# Patient Record
Sex: Female | Born: 1975 | Race: White | Hispanic: No | Marital: Married | Smoking: Never smoker
Health system: Southern US, Community
[De-identification: ages and names within clinical notes are randomized; demographics above are authoritative.]

## PROBLEM LIST (undated history)

## (undated) DIAGNOSIS — N200 Calculus of kidney: Secondary | ICD-10-CM

## (undated) DIAGNOSIS — I781 Nevus, non-neoplastic: Principal | ICD-10-CM

## (undated) DIAGNOSIS — E039 Hypothyroidism, unspecified: Secondary | ICD-10-CM

## (undated) HISTORY — PX: TUBAL LIGATION: SHX77

## (undated) HISTORY — DX: Nevus, non-neoplastic: I78.1

---

## 2004-03-31 ENCOUNTER — Emergency Department (HOSPITAL_COMMUNITY): Admission: EM | Admit: 2004-03-31 | Discharge: 2004-03-31 | Payer: Self-pay | Admitting: Emergency Medicine

## 2004-05-31 ENCOUNTER — Ambulatory Visit: Payer: Self-pay | Admitting: Family Medicine

## 2004-12-20 ENCOUNTER — Inpatient Hospital Stay (HOSPITAL_COMMUNITY): Admission: AD | Admit: 2004-12-20 | Discharge: 2004-12-20 | Payer: Self-pay | Admitting: Obstetrics and Gynecology

## 2004-12-28 ENCOUNTER — Other Ambulatory Visit: Admission: RE | Admit: 2004-12-28 | Discharge: 2004-12-28 | Payer: Self-pay | Admitting: Obstetrics and Gynecology

## 2005-05-15 ENCOUNTER — Ambulatory Visit (HOSPITAL_COMMUNITY): Admission: RE | Admit: 2005-05-15 | Discharge: 2005-05-15 | Payer: Self-pay | Admitting: Obstetrics and Gynecology

## 2005-06-23 ENCOUNTER — Inpatient Hospital Stay (HOSPITAL_COMMUNITY): Admission: AD | Admit: 2005-06-23 | Discharge: 2005-06-23 | Payer: Self-pay | Admitting: Obstetrics and Gynecology

## 2005-07-24 ENCOUNTER — Inpatient Hospital Stay (HOSPITAL_COMMUNITY): Admission: AD | Admit: 2005-07-24 | Discharge: 2005-07-26 | Payer: Self-pay | Admitting: Obstetrics and Gynecology

## 2006-12-01 IMAGING — US US RENAL
1 series · 14 of 25 positions shown · non-contrast
Comparison: none

HISTORY: Left flank pain, pregnant

[Series 1: us renal · 0.37mm/px · 14 of 51 slices shown]
[im 1/51]
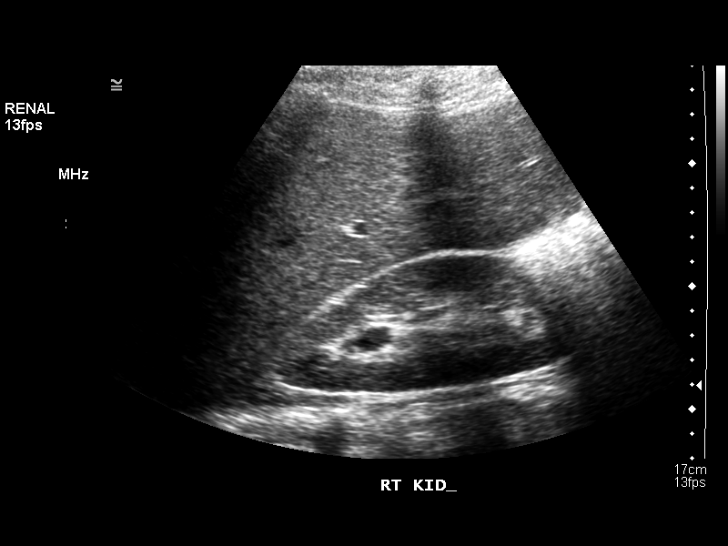
[im 5/51]
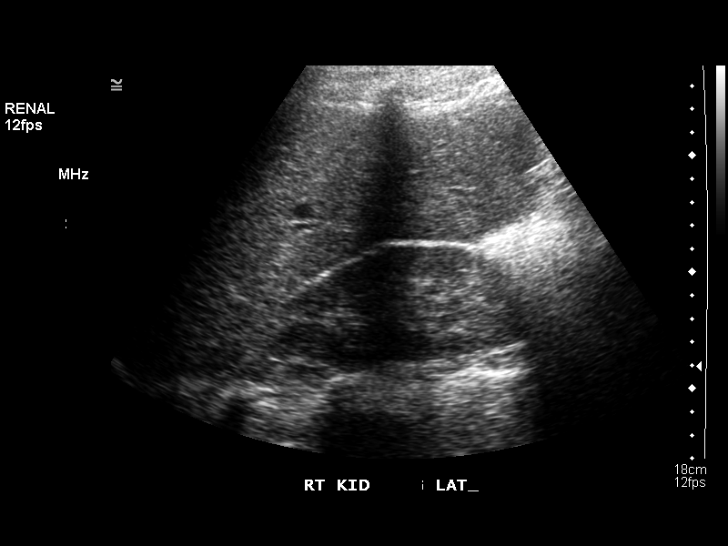
[im 9/51]
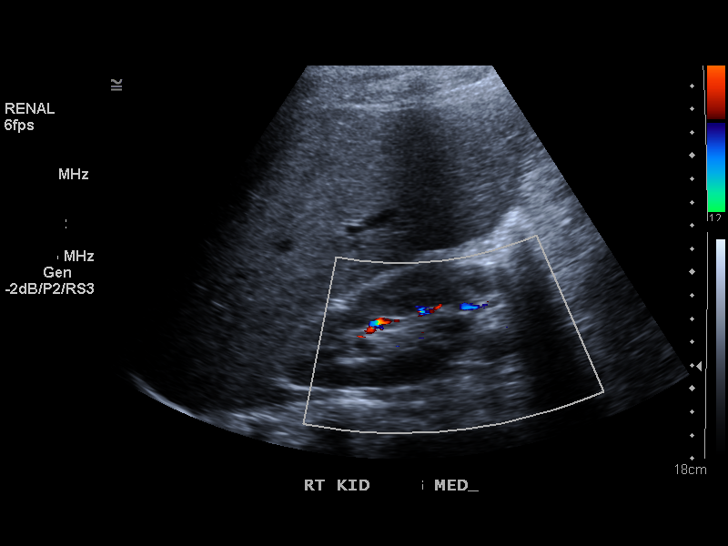
[im 13/51]
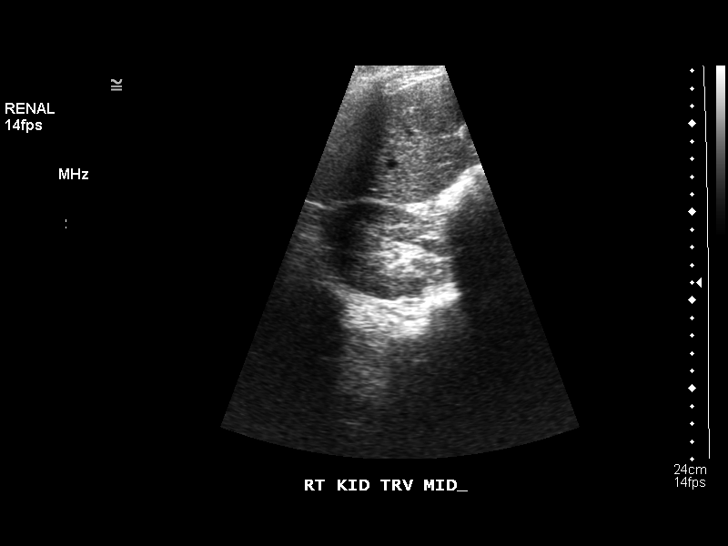
[im 17/51]
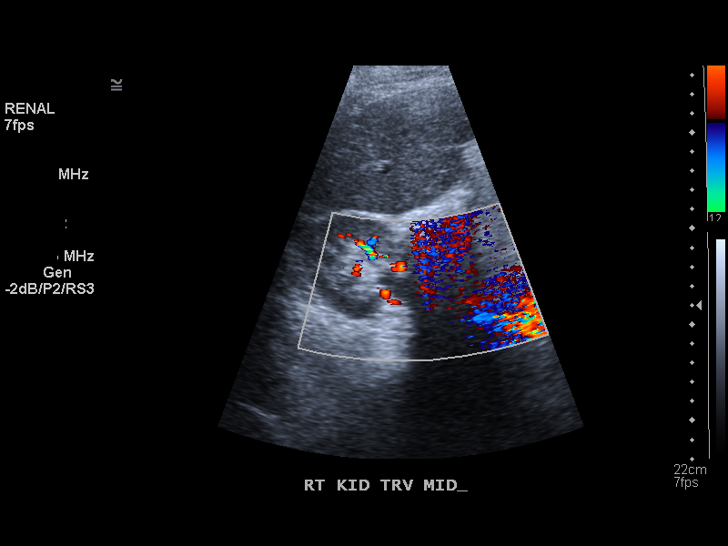
[im 19/51]
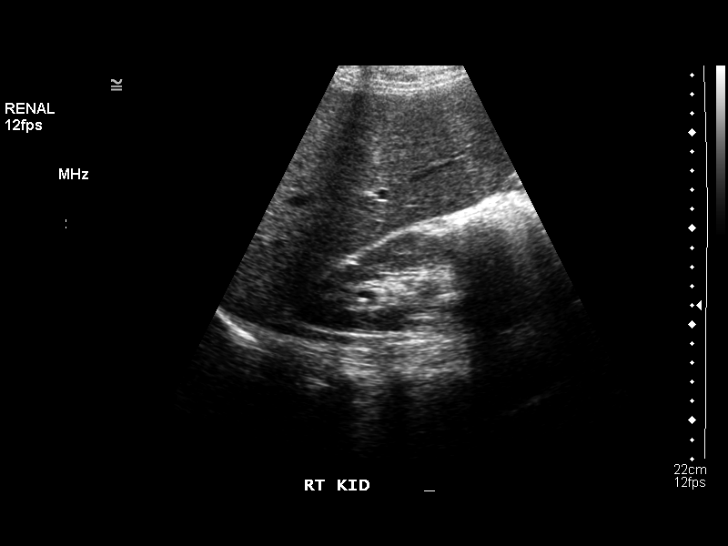
[im 23/51]
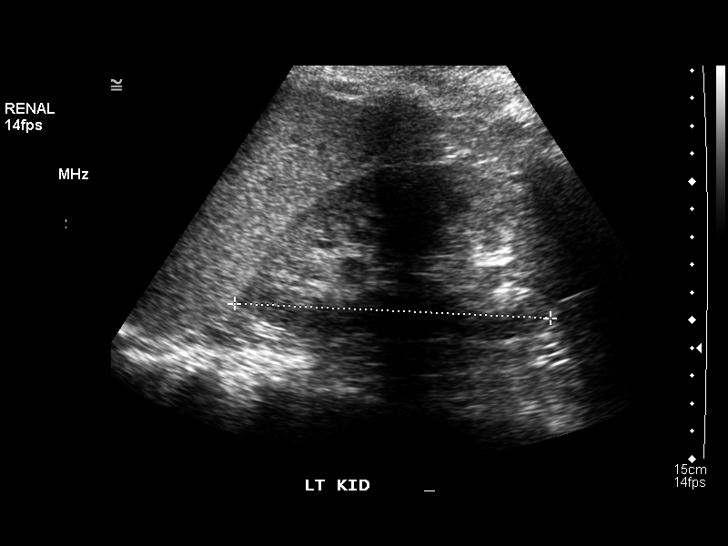
[im 28/51]
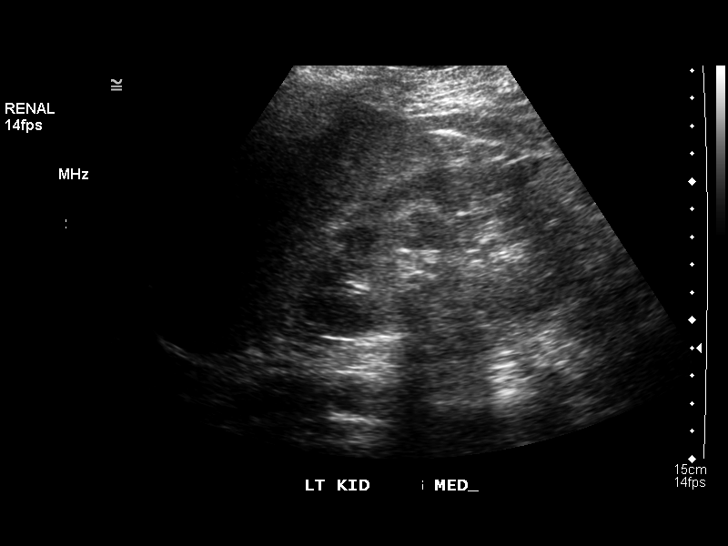
[im 32/51]
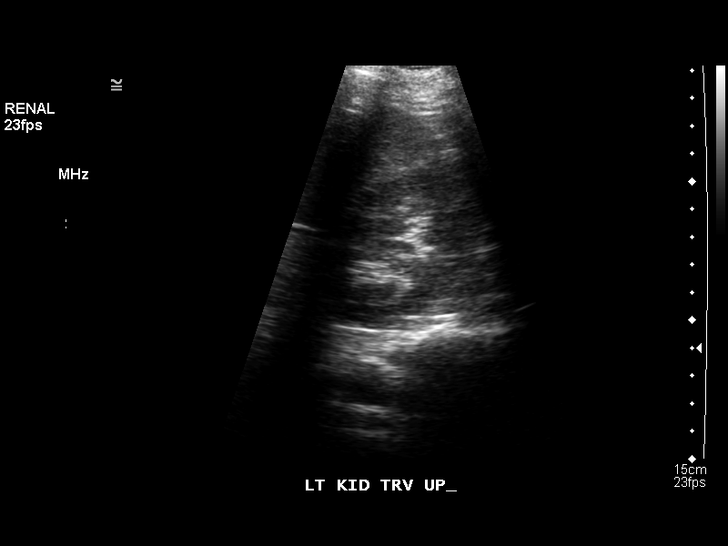
[im 34/51]
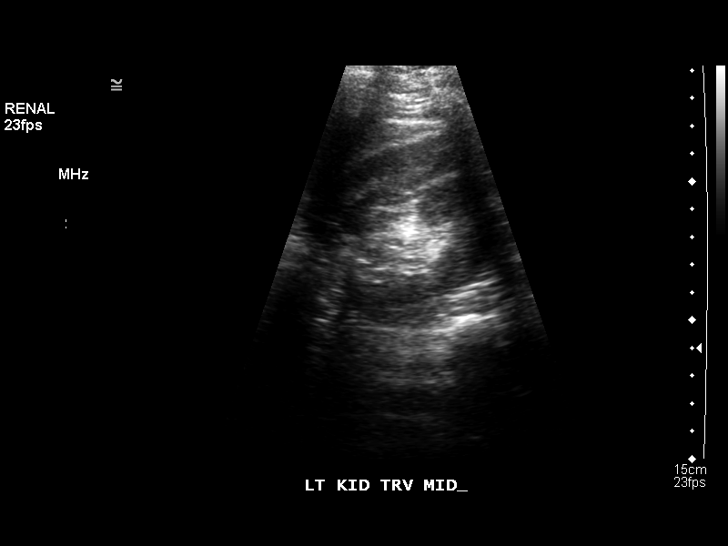
[im 38/51]
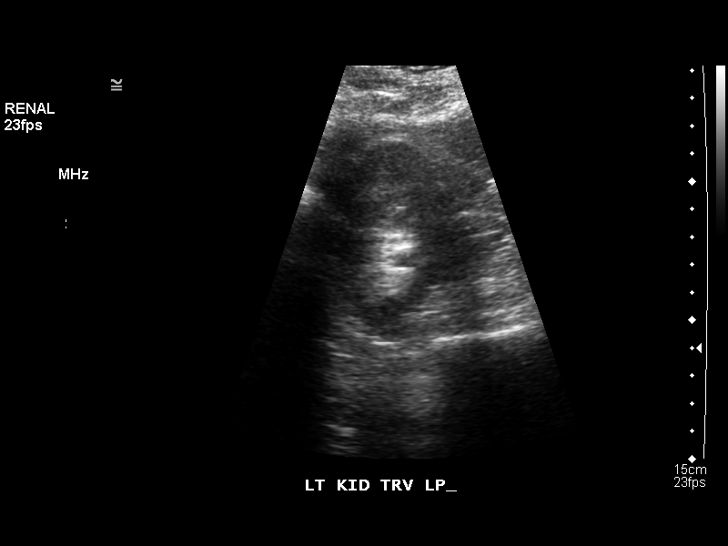
[im 42/51]
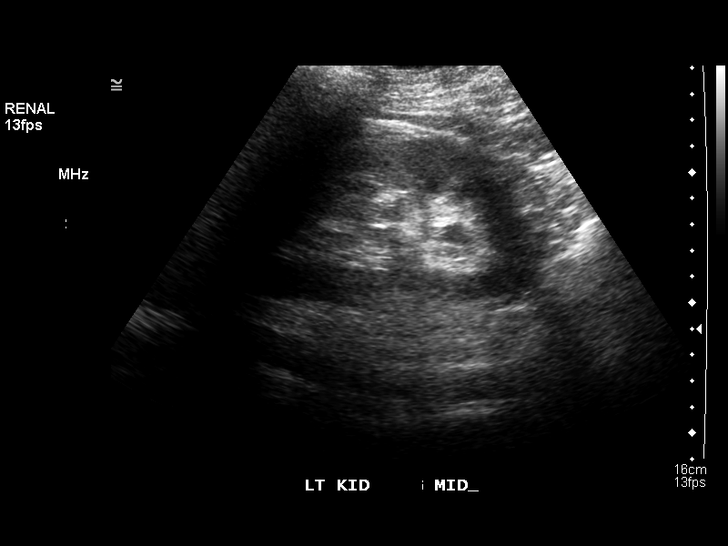
[im 46/51]
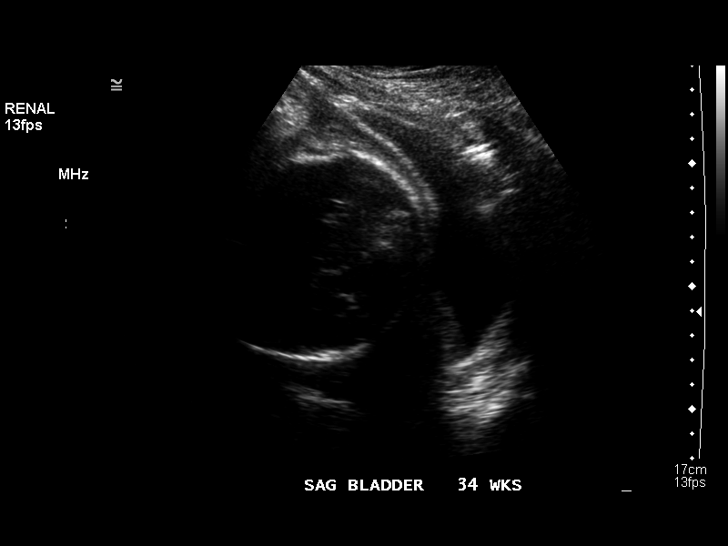
[im 51/51]
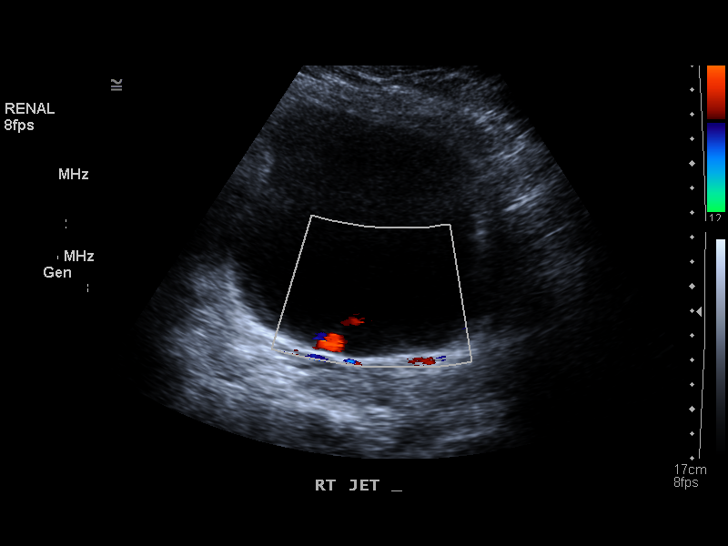

[14 of 25 positions shown; findings below may reference images not displayed]

RENAL ULTRASOUND:

Kidneys measure 11.9 cm length right and 11.7 cm length left.
Normal renal cortical thickness and echogenicity bilaterally.
No evidence of renal mass or hydronephrosis.
Very minimal caliectasis bilaterally, which may be seen in late pregnancy.
At lower pole left kidney, a single tiny shadowing echogenic focus is seen which
could represent a tiny calculus.
However, left renal pelvis does not appear dilated.
Urinary bladder normal appearance.
A right ureteral jet is visualized.
A left ureteral jet is not seen during exam.
IMPRESSION: Probable tiny calculus lower pole left kidney.
Minimal caliectasis bilaterally without hydronephrosis.
A left ureteral jet is not identified, cannot confirm left ureteral patency.

## 2007-04-16 ENCOUNTER — Encounter: Payer: Self-pay | Admitting: Family Medicine

## 2008-06-17 ENCOUNTER — Ambulatory Visit: Payer: Self-pay | Admitting: Gastroenterology

## 2010-05-19 NOTE — Discharge Summary (Signed)
[name], [name]                 ACCOUNT NO.:  [id number]   MEDICAL RECORD NO.:  [id number]          PATIENT TYPE:  INP   LOCATION:  9106                          FACILITY:  [facility]   PHYSICIAN:  Juluis Mire, M.D.   DATE OF BIRTH:  27-Jan-1975   DATE OF ADMISSION:  07/24/2005  DATE OF DISCHARGE:  07/26/2005                                 DISCHARGE SUMMARY   ADMITTING DIAGNOSES:  1. Intrauterine pregnancy at term.  2. Previous cesarean section, desires repeat.   DISCHARGE DIAGNOSES:  1. Status post low transverse cesarean section.  2. Viable female infant.   PROCEDURE:  Repeat low transverse cesarean section.   REASON FOR ADMISSION:  Please see dictated H&P.   HOSPITAL COURSE:  Patient is a 35 year old white married female gravida 2,  para 1 that was admitted to [facility] for scheduled  cesarean section.  Patient had had a previous cesarean delivery and desired  repeat.  On the morning of admission patient was taken to the operating room  where spinal anesthesia was administered without difficulty.  A low  transverse incision was made with delivery of a viable female infant weighing  7 pounds 10 ounces with Apgars of 8 at one minute, 9 at five minutes.  Patient tolerated procedure well and was taken to recovery room in stable  condition.  On postoperative day one patient was without complaint.  Pain  was well-controlled, tolerating a regular diet without complaints of nausea  and vomiting.  Vital signs were stable.  She was afebrile.  Abdomen soft.  Abdominal dressing was noted to be clean, dry, and intact.  Laboratory  findings revealed hemoglobin of 10.6, platelet count 178,000, WBC count was  7.6.  Patient was known to be Rh negative.  RhoGAM studies were pending.  On  postoperative day two patient desired early discharge.  Vital signs were  stable.  She was afebrile.  Fundus firm and nontender.  Incision was clean,  dry, and intact.  Staples were intact.   RhoGAM was administered.  Discharge  instructions reviewed and patient was discharged home.   CONDITION ON DISCHARGE:  Good.   DIET:  Regular, as tolerated.   ACTIVITY:  No heavy lifting, no driving x2 weeks, no vaginal entry.   FOLLOW-UP:  Patient to follow up in the office in three days for a staple  removal.  She is to call for temperature greater than 100.5, redness or  drainage from the incisional site, heavy vaginal bleeding and/or redness or  drainage from the incisional site.   DISCHARGE MEDICATIONS:  1. Percocet 5/325, #30 one p.o. every four to six hours p.r.n.  2. Motrin 600 mg every six hours.  3. Prenatal vitamins one p.o. daily.      Julio Sicks, N.P.      Juluis Mire, M.D.  Electronically Signed    CC/MEDQ  D:  08/22/2005  T:  08/22/2005  Job:  [id number]

## 2010-05-19 NOTE — Op Note (Signed)
[name], [name]                 ACCOUNT NO.:  [id number]   MEDICAL RECORD NO.:  [id number]          PATIENT TYPE:  INP   LOCATION:  9399                          FACILITY:  [facility]   PHYSICIAN:  Guy Sandifer. Henderson Cloud, M.D. DATE OF BIRTH:  [birth year]   DATE OF PROCEDURE:  07/24/2005  DATE OF DISCHARGE:  06/23/2005                                 OPERATIVE REPORT   PREOPERATIVE DIAGNOSIS:  Previous cesarean section, desires repeat.   POSTOPERATIVE DIAGNOSIS:  Previous cesarean section, desires repeat.   PROCEDURE:  Repeat low transverse cesarean section.   SURGEON:  Harold Hedge, MD   ANESTHESIA:  Spinal, Raul Del, M.D.   ESTIMATED BLOOD LOSS:  800 mL.   FINDINGS:  Viable female infant, Apgars of 8 and 9 at one and five minutes,  respectively.  Birth weight 7 pounds 10 ounces.  Arterial cord pH pending.   INDICATIONS AND CONSENT:  The patient is 35 year old married white female  G2, P1 with an EDC of 08/01/2005.  She has previous cesarean section.  After  careful consideration of the options, she desires repeat.  Potential risks  and complications have been discussed preoperatively including but not  limited to infection, bowel, bladder, ureteral damage, bleeding requiring  transfusion of blood products with possible transfusion reaction, HIV and  hepatitis acquisition, DVT, PE, pneumonia.  All questions were answered and  consent is signed on the chart.   PROCEDURE:  The patient taken to operating room where she is identified.  Spinal anesthetic is placed and she is placed in dorsosupine position with a  15 degree left lateral wedge.  She is then prepped, Foley catheter was  placed and the bladder is drain and she is draped in sterile fashion.  After  testing for adequate spinal anesthesia, skin is entered through the previous  Pfannenstiel scar.  Dissection is carried out in layers to the peritoneum.  The peritoneum is incised, extended superiorly and inferiorly.  Vesicouterine peritoneum was taken down cephalolaterally.  The bladder flap  was developed.  The bladder blade was placed.  Uterus was incised in a low  transverse manner.  The uterine cavity is entered bluntly with a hemostat.  Uterine incision was then extended cephalolaterally with the fingers.  Clear  fluid is noted.  Vertex was delivered and the oro nasopharynx were  suctioned.  Remainder of baby is delivered.  Good cry and tone is noted.  Cord is clamped and cut.  The baby is handed to the waiting pediatrics team.  Placenta was manually delivered.  Uterus is clean.  Uterus is closed in two  running locking imbricating layers of 0 Monocryl suture.  A single figure-of-  eight is placed in the middle of the incision to obtain complete hemostasis.  Tubes and ovaries were normal bilaterally.  There is a single adhesion of  the omentum to the anterior abdominal wall which is at taken down with  cautery without difficulty.  Good hemostasis was noted there as well.  Anterior peritoneum was closed in running fashion with 0 Monocryl suture  which was also  used  to reapproximate the pyramidalis muscle in the midline.  The anterior  rectus fascia is closed in running fashion with 0 PDS suture and skin was  closed with clips.  All sponge, instrument and needle counts were correct.  The patient is transferred to recovery room in stable condition.      Guy Sandifer Henderson Cloud, M.D.  Electronically Signed     JET/MEDQ  D:  07/24/2005  T:  07/24/2005  Job:  [id number]

## 2010-05-19 NOTE — H&P (Signed)
[name], [name]                 ACCOUNT NO.:  [id number]   MEDICAL RECORD NO.:  [id number]          PATIENT TYPE:  INP   LOCATION:  NA                            [facility]  [facility]   PHYSICIAN:  Guy Sandifer. Henderson Cloud, M.D. DATE OF BIRTH:  05/01/1975   DATE OF ADMISSION:  07/24/2005  DATE OF DISCHARGE:                                HISTORY & PHYSICAL   CHIEF COMPLAINT:  Desires repeat cesarean section.   HISTORY OF PRESENT ILLNESS:  This patient is a 35 year old married female,  G2, P46, with an EDC of August 01, 2005, with a previous cesarean section.  After careful discussion of the options, she is being admitted for repeat  cesarean section.   PAST MEDICAL HISTORY, PAST SURGICAL HISTORY, FAMILY HISTORY, OBSTETRIC  HISTORY:  See prenatal History and Physical.   ALLERGIES:  SULFA.   MEDICATIONS:  Prenatal vitamins.   SOCIAL HISTORY:  Denies tobacco, alcohol, or drug abuse.   REVIEW OF SYSTEMS:  NEUROLOGIC: Denies headache.  CARDIAC: Denies chest  pain.  GI: Denies recent changes in bowel habits.   PHYSICAL EXAMINATION:  VITAL SIGNS: Height 5 feet 5-1/2 inches.  Weight 224  pounds. Blood pressure 104/80.  LUNGS: Clear to auscultation.  HEART: Regular rate and rhythm.  BREASTS:  Not examined.  ABDOMEN:  Gravid, nontender.  PELVIC:  Cervix closed, thick, -1, vertex.  NEUROLOGIC:  Grossly within normal limits.   ASSESSMENT:  1.  Intrauterine pregnancy at approximately 39 weeks estimated gestational      age.  2.  Previous cesarean section.   PLAN:  Repeat cesarean section.     Guy Sandifer Henderson Cloud, M.D.  Electronically Signed    JET/MEDQ  D:  07/23/2005  T:  07/23/2005  Job:  [id number]

## 2010-07-02 ENCOUNTER — Inpatient Hospital Stay (HOSPITAL_COMMUNITY): Admission: AD | Admit: 2010-07-02 | Payer: Self-pay | Source: Home / Self Care | Admitting: Obstetrics and Gynecology

## 2010-07-17 ENCOUNTER — Encounter (HOSPITAL_COMMUNITY): Payer: Self-pay

## 2010-07-17 ENCOUNTER — Encounter (HOSPITAL_COMMUNITY)
Admission: RE | Admit: 2010-07-17 | Discharge: 2010-07-17 | Disposition: A | Discharge status: Home / Self Care | Payer: BC Managed Care – PPO | Source: Ambulatory Visit | Attending: Obstetrics and Gynecology | Admitting: Obstetrics and Gynecology

## 2010-07-17 HISTORY — DX: Hypothyroidism, unspecified: E03.9

## 2010-07-17 LAB — CBC
HCT: 38.4 % (ref 36.0–46.0)
Hemoglobin: 12.9 g/dL (ref 12.0–15.0)
MCV: 87.9 fL (ref 78.0–100.0)
Platelets: 160 10*3/uL (ref 150–400)
RDW: 15 % (ref 11.5–15.5)

## 2010-07-17 LAB — SURGICAL PCR SCREEN
MRSA, PCR: NEGATIVE
Staphylococcus aureus: NEGATIVE

## 2010-07-17 NOTE — Anesthesia Preprocedure Evaluation (Addendum)
Anesthesia Evaluation  Name, MR# and DOB Patient awake  General Assessment Comment  Airway       Dental   Pulmonaryneg pulmonary ROS        Cardiovascular    Neuro/PsychNegative Neurological ROS Negative Psych ROS  GI/Hepatic/Renal negative GI ROS, negative Liver ROS, and negative Renal ROS (+)       Endo/Other   Abdominal   Musculoskeletal negative musculoskeletal ROS (+)  Hematology negative hematology ROS (+)   Peds negative pediatric ROS (+)  Reproductive/Obstetrics (+) Pregnancy   Anesthesia Other Findings             Anesthesia Physical Anesthesia Plan  ASA: II  Anesthesia Plan: Spinal   Post-op Pain Management:    Induction:   Airway Management Planned:   Additional Equipment:   Intra-op Plan:   Post-operative Plan:   Informed Consent: I have reviewed the patients History and Physical, chart, labs and discussed the procedure including the risks, benefits and alternatives for the proposed anesthesia with the patient or authorized representative who has indicated his/her understanding and acceptance.     Plan Discussed with:   Anesthesia Plan Comments:         Anesthesia Quick Evaluation

## 2010-07-17 NOTE — Patient Instructions (Addendum)
20 [name]  07/17/2010   Your procedure is scheduled on:  07/24/10    7:30  Report to [facility] at 600 AM.  Call this number if you have problems the morning of surgery: [phone]   Remember:   Do not eat food:After Midnight.  Do not drink clear liquids: After Midnight.  Take these medicines the morning of surgery with A SIP OF WATER: NA   Do not wear jewelry, make-up or nail polish.  Do not bring valuables to the hospital.  Contacts, dentures or bridgework may not be worn into surgery.  Leave suitcase in the car. After surgery it may be brought to your room.  For patients admitted to the hospital, checkout time is 11:00 AM the day of discharge.   Patients discharged the day of surgery will not be allowed to drive home.  Name and phone number of your driver: NA  Special Instructions:  Use CHG soap; 1/2 bottle PM prior to surgery and 1/2 bottle AM of surgery. Please read over the following fact sheets that you were given: Pain Booklet and Surgical Site Infection Prevention

## 2010-07-19 ENCOUNTER — Ambulatory Visit (HOSPITAL_COMMUNITY): Payer: Self-pay

## 2010-07-21 ENCOUNTER — Encounter (HOSPITAL_COMMUNITY): Payer: Self-pay | Admitting: *Deleted

## 2010-07-24 ENCOUNTER — Inpatient Hospital Stay (HOSPITAL_COMMUNITY)
Admission: RE | Admit: 2010-07-24 | Discharge: 2010-07-26 | DRG: 371 | Disposition: A | Discharge status: Home / Self Care | Payer: BC Managed Care – PPO | Source: Ambulatory Visit | Attending: Obstetrics and Gynecology | Admitting: Obstetrics and Gynecology

## 2010-07-24 ENCOUNTER — Inpatient Hospital Stay (HOSPITAL_COMMUNITY): Payer: BC Managed Care – PPO | Admitting: Registered Nurse

## 2010-07-24 ENCOUNTER — Encounter (HOSPITAL_COMMUNITY): Payer: Self-pay | Admitting: Anesthesiology

## 2010-07-24 ENCOUNTER — Encounter (HOSPITAL_COMMUNITY): Payer: Self-pay | Admitting: Registered Nurse

## 2010-07-24 ENCOUNTER — Encounter (HOSPITAL_COMMUNITY): Payer: Self-pay | Admitting: Obstetrics and Gynecology

## 2010-07-24 ENCOUNTER — Encounter (HOSPITAL_COMMUNITY)
Admission: RE | Disposition: A | Discharge status: Home / Self Care | Payer: Self-pay | Source: Ambulatory Visit | Attending: Obstetrics and Gynecology

## 2010-07-24 ENCOUNTER — Other Ambulatory Visit: Payer: Self-pay | Admitting: Obstetrics and Gynecology

## 2010-07-24 DIAGNOSIS — O34219 Maternal care for unspecified type scar from previous cesarean delivery: Principal | ICD-10-CM | POA: Diagnosis present

## 2010-07-24 DIAGNOSIS — Z01812 Encounter for preprocedural laboratory examination: Secondary | ICD-10-CM

## 2010-07-24 DIAGNOSIS — D649 Anemia, unspecified: Secondary | ICD-10-CM

## 2010-07-24 DIAGNOSIS — E079 Disorder of thyroid, unspecified: Secondary | ICD-10-CM | POA: Diagnosis present

## 2010-07-24 DIAGNOSIS — Z302 Encounter for sterilization: Secondary | ICD-10-CM

## 2010-07-24 DIAGNOSIS — E039 Hypothyroidism, unspecified: Secondary | ICD-10-CM | POA: Diagnosis present

## 2010-07-24 DIAGNOSIS — Z01818 Encounter for other preprocedural examination: Secondary | ICD-10-CM

## 2010-07-24 LAB — TYPE AND SCREEN
ABO/RH(D): A NEG
Antibody Screen: NEGATIVE

## 2010-07-24 SURGERY — Surgical Case
Anesthesia: Choice

## 2010-07-24 MED ORDER — OXYCODONE-ACETAMINOPHEN 5-325 MG PO TABS
1.0000 | ORAL_TABLET | ORAL | Status: DC | PRN
  Administered 2010-07-25 (×2): 1 via ORAL
  Filled 2010-07-24 (×3): qty 1

## 2010-07-24 MED ORDER — NALBUPHINE SYRINGE 5 MG/0.5 ML
5.0000 mg | INJECTION | INTRAMUSCULAR | Status: AC | PRN
  Filled 2010-07-24: qty 1

## 2010-07-24 MED ORDER — ONDANSETRON HCL 4 MG/2ML IJ SOLN
INTRAMUSCULAR | Status: AC
  Filled 2010-07-24: qty 4

## 2010-07-24 MED ORDER — CLINDAMYCIN PHOSPHATE 900 MG/50ML IV SOLN
900.0000 mg | Freq: Once | INTRAVENOUS | Status: AC
  Administered 2010-07-24: 900 mg via INTRAVENOUS
  Filled 2010-07-24: qty 50

## 2010-07-24 MED ORDER — KETOROLAC TROMETHAMINE 60 MG/2ML IM SOLN
60.0000 mg | Freq: Once | INTRAMUSCULAR | Status: AC | PRN
  Administered 2010-07-24: 60 mg via INTRAMUSCULAR

## 2010-07-24 MED ORDER — MEPERIDINE HCL 25 MG/ML IJ SOLN: 6.2500 mg | INTRAMUSCULAR | Status: DC | PRN

## 2010-07-24 MED ORDER — EPHEDRINE SULFATE 50 MG/ML IJ SOLN
INTRAMUSCULAR | Status: DC | PRN
  Administered 2010-07-24: 5 mg via INTRAVENOUS
  Administered 2010-07-24: 10 mg via INTRAVENOUS
  Administered 2010-07-24: 5 mg via INTRAVENOUS
  Administered 2010-07-24: 15 mg via INTRAVENOUS

## 2010-07-24 MED ORDER — ONDANSETRON HCL 4 MG/2ML IJ SOLN: 4.0000 mg | Freq: Three times a day (TID) | INTRAMUSCULAR | Status: DC | PRN

## 2010-07-24 MED ORDER — KETOROLAC TROMETHAMINE 30 MG/ML IJ SOLN: 30.0000 mg | Freq: Four times a day (QID) | INTRAMUSCULAR | Status: AC | PRN

## 2010-07-24 MED ORDER — SIMETHICONE 80 MG PO CHEW
80.0000 mg | CHEWABLE_TABLET | Freq: Three times a day (TID) | ORAL | Status: DC
  Administered 2010-07-24 – 2010-07-26 (×8): 80 mg via ORAL

## 2010-07-24 MED ORDER — DIPHENHYDRAMINE HCL 50 MG/ML IJ SOLN: 25.0000 mg | INTRAMUSCULAR | Status: DC | PRN

## 2010-07-24 MED ORDER — SODIUM CHLORIDE 0.9 % IV SOLN
1.0000 ug/kg/h | INTRAVENOUS | Status: DC | PRN
  Filled 2010-07-24: qty 2.5

## 2010-07-24 MED ORDER — ZOLPIDEM TARTRATE 5 MG PO TABS: 5.0000 mg | ORAL_TABLET | Freq: Every evening | ORAL | Status: DC | PRN

## 2010-07-24 MED ORDER — SCOPOLAMINE 1 MG/3DAYS TD PT72
MEDICATED_PATCH | TRANSDERMAL | Status: AC
  Administered 2010-07-24: 1.5 mg via TRANSDERMAL
  Filled 2010-07-24: qty 1

## 2010-07-24 MED ORDER — PHENYLEPHRINE HCL 10 MG/ML IJ SOLN
INTRAMUSCULAR | Status: DC | PRN
  Administered 2010-07-24: 40 ug via INTRAVENOUS
  Administered 2010-07-24 (×2): 80 ug via INTRAVENOUS
  Administered 2010-07-24 (×3): 40 ug via INTRAVENOUS
  Administered 2010-07-24 (×2): 80 ug via INTRAVENOUS
  Administered 2010-07-24: 40 ug via INTRAVENOUS

## 2010-07-24 MED ORDER — DIPHENHYDRAMINE HCL 50 MG/ML IJ SOLN: 12.5000 mg | INTRAMUSCULAR | Status: DC | PRN

## 2010-07-24 MED ORDER — KETOROLAC TROMETHAMINE 60 MG/2ML IM SOLN
INTRAMUSCULAR | Status: AC
  Administered 2010-07-24: 60 mg via INTRAMUSCULAR
  Filled 2010-07-24: qty 2

## 2010-07-24 MED ORDER — FENTANYL CITRATE 0.05 MG/ML IJ SOLN
INTRAMUSCULAR | Status: AC
  Filled 2010-07-24: qty 2

## 2010-07-24 MED ORDER — SCOPOLAMINE 1 MG/3DAYS TD PT72
1.0000 | MEDICATED_PATCH | Freq: Once | TRANSDERMAL | Status: AC
  Administered 2010-07-24: 1.5 mg via TRANSDERMAL

## 2010-07-24 MED ORDER — IBUPROFEN 600 MG PO TABS: 600.0000 mg | ORAL_TABLET | Freq: Four times a day (QID) | ORAL | Status: DC | PRN

## 2010-07-24 MED ORDER — SENNOSIDES-DOCUSATE SODIUM 8.6-50 MG PO TABS
1.0000 | ORAL_TABLET | Freq: Every day | ORAL | Status: DC
  Administered 2010-07-24 – 2010-07-25 (×2): 2 via ORAL

## 2010-07-24 MED ORDER — ONDANSETRON HCL 4 MG/2ML IJ SOLN: 4.0000 mg | INTRAMUSCULAR | Status: DC | PRN

## 2010-07-24 MED ORDER — LACTATED RINGERS IV SOLN
Freq: Once | INTRAVENOUS | Status: AC
  Administered 2010-07-24: 15:00:00 via INTRAVENOUS

## 2010-07-24 MED ORDER — ONDANSETRON HCL 4 MG PO TABS: 4.0000 mg | ORAL_TABLET | ORAL | Status: DC | PRN

## 2010-07-24 MED ORDER — FENTANYL CITRATE 0.05 MG/ML IJ SOLN
INTRAMUSCULAR | Status: DC | PRN
  Administered 2010-07-24: 15 ug via INTRATHECAL

## 2010-07-24 MED ORDER — MORPHINE SULFATE 0.5 MG/ML IJ SOLN
INTRAMUSCULAR | Status: AC
  Filled 2010-07-24: qty 10

## 2010-07-24 MED ORDER — METOCLOPRAMIDE HCL 5 MG/ML IJ SOLN: 10.0000 mg | Freq: Three times a day (TID) | INTRAMUSCULAR | Status: DC | PRN

## 2010-07-24 MED ORDER — DIPHENHYDRAMINE HCL 25 MG PO CAPS: 25.0000 mg | ORAL_CAPSULE | Freq: Four times a day (QID) | ORAL | Status: DC | PRN

## 2010-07-24 MED ORDER — WITCH HAZEL-GLYCERIN EX PADS: MEDICATED_PAD | CUTANEOUS | Status: DC | PRN

## 2010-07-24 MED ORDER — OXYTOCIN 20 UNITS IN LACTATED RINGERS INFUSION - SIMPLE
INTRAVENOUS | Status: AC
  Administered 2010-07-24: 20 [IU] via INTRAVENOUS
  Filled 2010-07-24: qty 1000

## 2010-07-24 MED ORDER — OXYTOCIN 10 UNIT/ML IJ SOLN
INTRAMUSCULAR | Status: AC
  Filled 2010-07-24: qty 4

## 2010-07-24 MED ORDER — TETANUS-DIPHTH-ACELL PERTUSSIS 5-2.5-18.5 LF-MCG/0.5 IM SUSP
0.5000 mL | Freq: Once | INTRAMUSCULAR | Status: AC
  Administered 2010-07-25: 0.5 mL via INTRAMUSCULAR

## 2010-07-24 MED ORDER — IBUPROFEN 600 MG PO TABS
600.0000 mg | ORAL_TABLET | Freq: Four times a day (QID) | ORAL | Status: DC
  Administered 2010-07-24 – 2010-07-26 (×8): 600 mg via ORAL
  Filled 2010-07-24 (×8): qty 1

## 2010-07-24 MED ORDER — PHENYLEPHRINE 40 MCG/ML (10ML) SYRINGE FOR IV PUSH (FOR BLOOD PRESSURE SUPPORT)
PREFILLED_SYRINGE | INTRAVENOUS | Status: AC
  Filled 2010-07-24: qty 5

## 2010-07-24 MED ORDER — MENTHOL 3 MG MT LOZG: 1.0000 | LOZENGE | OROMUCOSAL | Status: DC | PRN

## 2010-07-24 MED ORDER — BUPIVACAINE HCL 0.75 % IJ SOLN
INTRAMUSCULAR | Status: DC | PRN
  Administered 2010-07-24: 11.75 mg via INTRATHECAL

## 2010-07-24 MED ORDER — EPHEDRINE 5 MG/ML INJ
INTRAVENOUS | Status: AC
  Filled 2010-07-24: qty 10

## 2010-07-24 MED ORDER — OXYTOCIN 10 UNIT/ML IJ SOLN
INTRAMUSCULAR | Status: DC | PRN
  Administered 2010-07-24: 20 [IU] via INTRAMUSCULAR

## 2010-07-24 MED ORDER — DIPHENHYDRAMINE HCL 25 MG PO CAPS
25.0000 mg | ORAL_CAPSULE | ORAL | Status: DC | PRN
  Administered 2010-07-24: 25 mg via ORAL
  Filled 2010-07-24 (×2): qty 1

## 2010-07-24 MED ORDER — LEVOTHYROXINE SODIUM 100 MCG PO TABS
100.0000 ug | ORAL_TABLET | Freq: Every day | ORAL | Status: DC
Start: 2010-07-24 — End: 2010-07-26
  Administered 2010-07-25 – 2010-07-26 (×2): 100 ug via ORAL
  Filled 2010-07-24 (×3): qty 1

## 2010-07-24 MED ORDER — MORPHINE SULFATE (PF) 0.5 MG/ML IJ SOLN
INTRAMUSCULAR | Status: DC | PRN
  Administered 2010-07-24: .1 mg via INTRATHECAL

## 2010-07-24 MED ORDER — SODIUM CHLORIDE 0.9 % IR SOLN
Status: DC | PRN
  Administered 2010-07-24: 1

## 2010-07-24 MED ORDER — SODIUM CHLORIDE 0.9 % IJ SOLN: 3.0000 mL | INTRAMUSCULAR | Status: DC | PRN

## 2010-07-24 MED ORDER — ONDANSETRON HCL 4 MG/2ML IJ SOLN
INTRAMUSCULAR | Status: DC | PRN
  Administered 2010-07-24: 4 mg via INTRAVENOUS

## 2010-07-24 MED ORDER — LACTATED RINGERS IV SOLN: INTRAVENOUS | Status: DC

## 2010-07-24 MED ORDER — SIMETHICONE 80 MG PO CHEW
80.0000 mg | CHEWABLE_TABLET | ORAL | Status: DC | PRN
Start: 2010-07-24 — End: 2010-07-26

## 2010-07-24 MED ORDER — OXYTOCIN 20 UNITS IN LACTATED RINGERS INFUSION - SIMPLE
125.0000 mL/h | INTRAVENOUS | Status: AC
  Administered 2010-07-24: 20 [IU] via INTRAVENOUS

## 2010-07-24 MED ORDER — NALOXONE HCL 0.4 MG/ML IJ SOLN: 0.4000 mg | INTRAMUSCULAR | Status: DC | PRN

## 2010-07-24 MED ORDER — LACTATED RINGERS IV SOLN
INTRAVENOUS | Status: DC
  Administered 2010-07-24 (×4): via INTRAVENOUS

## 2010-07-24 MED ORDER — PRENATAL PLUS 27-1 MG PO TABS
1.0000 | ORAL_TABLET | Freq: Every day | ORAL | Status: DC
  Administered 2010-07-25 – 2010-07-26 (×2): 1 via ORAL
  Filled 2010-07-24 (×2): qty 1

## 2010-07-24 SURGICAL SUPPLY — 27 items
CLOTH BEACON ORANGE TIMEOUT ST (SAFETY) ×2 IMPLANT
CONTAINER PREFILL 10% NBF 15ML (MISCELLANEOUS) IMPLANT
DRAPE UTILITY XL STRL (DRAPES) ×2 IMPLANT
DRESSING TELFA 8X3 (GAUZE/BANDAGES/DRESSINGS) IMPLANT
ELECT REM PT RETURN 9FT ADLT (ELECTROSURGICAL) ×2
ELECTRODE REM PT RTRN 9FT ADLT (ELECTROSURGICAL) ×1 IMPLANT
EXTRACTOR VACUUM M CUP 4 TUBE (SUCTIONS) IMPLANT
GAUZE SPONGE 4X4 12PLY STRL LF (GAUZE/BANDAGES/DRESSINGS) ×4 IMPLANT
GLOVE BIO SURGEON STRL SZ8 (GLOVE) ×4 IMPLANT
GOWN BRE IMP SLV AUR LG STRL (GOWN DISPOSABLE) ×2 IMPLANT
GOWN BRE IMP SLV AUR XL STRL (GOWN DISPOSABLE) ×2 IMPLANT
KIT ABG SYR 3ML LUER SLIP (SYRINGE) ×2 IMPLANT
NDL HYPO 25X5/8 SAFETYGLIDE (NEEDLE) ×1 IMPLANT
NEEDLE HYPO 25X5/8 SAFETYGLIDE (NEEDLE) ×2 IMPLANT
NS IRRIG 1000ML POUR BTL (IV SOLUTION) ×2 IMPLANT
PACK C SECTION WH (CUSTOM PROCEDURE TRAY) ×2 IMPLANT
PAD ABD 7.5X8 STRL (GAUZE/BANDAGES/DRESSINGS) IMPLANT
SLEEVE SCD COMPRESS KNEE MED (MISCELLANEOUS) IMPLANT
STAPLER VISISTAT 35W (STAPLE) IMPLANT
STRIP CLOSURE SKIN 1/4X4 (GAUZE/BANDAGES/DRESSINGS) ×2 IMPLANT
SUT MNCRL 0 VIOLET CTX 36 (SUTURE) ×4 IMPLANT
SUT MONOCRYL 0 CTX 36 (SUTURE) ×4
SUT PDS AB 0 CTX 60 (SUTURE) ×2 IMPLANT
SUT PLAIN 0 NONE (SUTURE) IMPLANT
TOWEL OR 17X24 6PK STRL BLUE (TOWEL DISPOSABLE) ×4 IMPLANT
TRAY FOLEY CATH 14FR (SET/KITS/TRAYS/PACK) ×2 IMPLANT
WATER STERILE IRR 1000ML POUR (IV SOLUTION) ×2 IMPLANT

## 2010-07-24 NOTE — Anesthesia Procedure Notes (Signed)
Spinal Block  Patient location during procedure: OR Start time: 07/24/2010 7:56 AM Staffing Anesthesiologist: CASSIDY, AMY L. Performed by: anesthesiologist  Preanesthetic Checklist Completed: patient identified, site marked, surgical consent, pre-op evaluation, timeout performed, IV checked, risks and benefits discussed and monitors and equipment checked Spinal Block Patient position: sitting Prep: DuraPrep Patient monitoring: heart rate, cardiac monitor, continuous pulse ox and blood pressure Approach: midline Location: L3-4 Injection technique: single-shot Needle Needle type: Sprotte  Needle gauge: 24 G Needle length: 5 cm Assessment Sensory level: T4

## 2010-07-24 NOTE — Brief Op Note (Signed)
07/24/2010  8:36 AM  PATIENT:  [name]  35 y.o. female G3P2 , previous c/s x2, desires repeat c/s and BTL.  PRE-OPERATIVE DIAGNOSIS:  prev cesarean section,marginal previa  POST-OPERATIVE DIAGNOSIS:  prev cesarean section,marginal previa  PROCEDURE:  Procedure(s): CESAREAN SECTION WITH BILATERAL TUBAL LIGATION  SURGEON:  Surgeon(s): Roselle Locus II  PHYSICIAN ASSISTANT:   ASSISTANTS: none   ANESTHESIA:   spinal ESTIMATED BLOOD LOSS: 700cc  BLOOD ADMINISTERED:none  DRAINS: Urinary Catheter (Foley)   LOCAL MEDICATIONS USED:  NONE  SPECIMEN:  Source of Specimen:  placenta  DISPOSITION OF SPECIMEN:  PATHOLOGY  COUNTS:  YES  TOURNIQUET:  * No tourniquets in log *  DICTATION #: [id number]  PLAN OF CARE: Post partum care  PATIENT DISPOSITION:  PACU - hemodynamically stable.   Delay start of Pharmacological VTE agent (>24hrs) due to surgical blood loss or risk of bleeding:  not applicable

## 2010-07-24 NOTE — Progress Notes (Signed)
Patient's output 75 ml since 1100. Urine concentrated and cloudy. Taking po fluids well, pulse rate increases into 150's with setting up or standing. Dr Henderson Cloud notified, order received for Bolus of IV Agnes Lawrence 07/24/2010 1426   .

## 2010-07-24 NOTE — Consult Note (Signed)
[facility]  Delivery Note:  C-section       07/24/2010  8:05 AM  I was called to the operating room at the request of the patient's obstetrician (Dr. Henderson Cloud) for repeat c/section.   PRENATAL HX:  Normal.  Prior c/section.  INTRAPARTUM HX:   No labor.  DELIVERY:   Vertex delivery.  Loose nuchal cord x 1.  Baby appeared vigorous.  Had frequent coughing so given bilateral chest percussions with my hand for 1-2 minutes.  He gradually pinked up, and had Apgar scores of 8 and 9.    _______________________________________________________________________ Electronically Signed By: Angelita Ingles, MD Neonatologist

## 2010-07-24 NOTE — Transfer of Care (Signed)
Immediate Anesthesia Transfer of Care Note  Patient: [name]  Procedure(s) Performed:  CESAREAN SECTION WITH BILATERAL TUBAL LIGATION - Repeat   Patient Location: PACU  Anesthesia Type: Spinal  Level of Consciousness: awake, alert  and oriented  Airway & Oxygen Therapy: Patient Spontanous Breathing  Post-op Assessment: Report given to PACU RN and Post -op Vital signs reviewed and stable  Post vital signs: Reviewed and stable  Complications: No apparent anesthesia complications

## 2010-07-24 NOTE — Op Note (Signed)
NAMESONITA, MICHIELS NO.:  [id number]  MEDICAL RECORD NO.:  [id number]  LOCATION:  [facility]                          FACILITY:  [facility]  PHYSICIAN:  Guy Sandifer. Henderson Cloud, M.D. DATE OF BIRTH:  [birth year]  DATE OF PROCEDURE:  07/24/2010 DATE OF DISCHARGE:                              OPERATIVE REPORT   PREOPERATIVE DIAGNOSES: 1. Intrauterine pregnancy 78 and one-half estimated gestational age. 2. Previous cesarean section, desires repeat. 3. Desires permanent sterilization.  POSTOPERATIVE DIAGNOSES: 1. Intrauterine pregnancy 20 and one-half estimated gestational age. 2. Previous cesarean section, desires repeat. 3. Desires permanent sterilization.  PROCEDURE:  Repeat low-transverse cesarean section and bilateral tubal ligation.  SURGEON:  Guy Sandifer. Henderson Cloud, M.D.  ANESTHESIA:  Spinal, Dana Allan, M.D.  ESTIMATED BLOOD LOSS:  700 cc.  SPECIMENS:  Placenta to pathology.  FINDINGS:  Viable female infant.  Arterial cord pH 7.28.  Birth weight and Apgars on the chart.  INDICATIONS AND CONSENT:  This patient is a 35 year old married white female G3, P2 with two previous cesarean section.  She desires repeat. Patient also desires permanent sterilization.  Potential risks and complications have been discussed preoperatively including, but not limited to infection, organ damage, bleeding requiring transfusion of blood products with HIV and hepatitis acquisition, DVT, PE, pneumonia. Permanence tubal ligation, alternate methods of contraception, failure rate, increased ectopic risk have been discussed as well.  All questions have been answered and consent is signed on the chart.  PROCEDURE IN DETAIL:  The patient taken to the operating room where she is identified.  Spinal anesthetic was placed and she is placed in dorsal supine position with 15 degrees left lateral wedge.  Time-out undertaken.  She is prepped, Foley catheter was placed.  The bladder was drained.  She is  draped in sterile fashion.  After testing for adequate spinal anesthesia, skin was entered through the previous Pfannenstiel scar.  Dissection was carried out in layers to the peritoneum. Peritoneum was incised, extended superiorly and inferiorly. Vesicouterine peritoneum was taken down cephalad laterally.  The bladder flap was developed.  Bladder blade was placed.  Uterus was incised in low-transverse manner.  Uterine cavity is entered bluntly with a hemostat and uterine incision was extended cephalad laterally. Artificial rupture of membranes reveals clear fluid.  Vertex was delivered.  One loose nuchal cord is reduced.  Baby is delivered with good cry and tone.  Cord is clamped and cut.  The baby is handed to awaiting pediatrics team.  Placenta was manually delivered.  A portion of the placenta over the left aspect of the lower uterine segment appeared to have been a partial accreta.  However, it separates very easily from the very thin lower uterine segment and is removed in its entirety.  Inspection reveals the cavity to be clean.  Uterus is then closed in a running locking fashion with 0 Monocryl suture, which obtained with good hemostasis.  The uterus was exteriorized to achieve this.  The left fallopian tubes identified from cornu to fimbria.  It is grasped in its mid ampullary portion.  A loop of tube was then doubly ligated with 2 free ties of plain suture.  The intervening knuckle  was then sharply resected.  Cautery was used for hemostasis.  Similar procedure was carried out on the right side.  Ovaries are normal. Uterus was returned to the abdomen.  Good hemostasis was again noted. Anterior peritoneum was closed in a running fashion with 0 Monocryl suture, which was also used to reapproximate the pyramidalis muscle in midline.  Anterior rectus fascia was closed in running fashion with 0 looped PDS suture and the skin was closed with clips.  All sponge, instrument, needle  counts were correct and the patient is transferred to recovery room in stable condition.     Guy Sandifer Henderson Cloud, M.D.     JET/MEDQ  D:  07/24/2010  T:  07/24/2010  Job:  [id number]

## 2010-07-24 NOTE — H&P (Signed)
[name] is a 35 y.o. female presenting for repeat c/s at 63 1/2 weeks.  Also wants BTL.   History OB History    Grav Para Term Preterm Abortions TAB SAB Ect Mult Living   4 2 2       2      Past Medical History  Diagnosis Date  . Hypothyroidism    Past Surgical History  Procedure Date  . Cesarean section '05 '07   Family History: family history is not on file. Social History:  reports that she has never smoked. She does not have any smokeless tobacco history on file. She reports that she does not drink alcohol or use illicit drugs.  Review of Systems  Constitutional: Negative.   HENT: Negative.   Respiratory: Negative.       Blood pressure 132/83, pulse 106, temperature 98.1 F (36.7 C), temperature source Oral, resp. rate 18, SpO2 99.00%. Maternal Exam:  Abdomen: Surgical scars: low transverse.     Physical Exam  Constitutional: She appears well-developed and well-nourished.  Cardiovascular: Normal rate and regular rhythm.   Respiratory: Effort normal and breath sounds normal.  GI: Soft. There is no tenderness.  Neurological: She has normal reflexes.    Prenatal labs: ABO, Rh: A NEG, A NEG (07/23 0555) Antibody: PENDING (07/23 0555) Rubella:   RPR: NON REACTIVE (07/16 1030)  HBsAg:    HIV:    GBS 35 yo G3P2 at 6 1/2 weeks for repeat C/S and BTL.  D/W pt and husband risks of surgery and BTL, all quesstions answered.  [name] II,[name] E 07/24/2010, 7:37 AM

## 2010-07-24 NOTE — Anesthesia Postprocedure Evaluation (Signed)
  Anesthesia Post-op Note  Patient: [name]  Procedure(s) Performed:  CESAREAN SECTION WITH BILATERAL TUBAL LIGATION - Repeat   No anesthesia complications.  Level of consciousness: alert. Cardiopulmonary status stable.  No follow-up care or observation required.  Verdean Murin L. Rodman Pickle, MD

## 2010-07-25 LAB — CBC
HCT: 22.8 % — ABNORMAL LOW (ref 36.0–46.0)
Platelets: 162 10*3/uL (ref 150–400)
RDW: 15.6 % — ABNORMAL HIGH (ref 11.5–15.5)
WBC: 6.9 10*3/uL (ref 4.0–10.5)

## 2010-07-25 MED ORDER — FERROUS SULFATE 325 (65 FE) MG PO TABS
325.0000 mg | ORAL_TABLET | Freq: Three times a day (TID) | ORAL | Status: DC
  Administered 2010-07-25 – 2010-07-26 (×4): 325 mg via ORAL
  Filled 2010-07-25 (×3): qty 1

## 2010-07-25 MED ORDER — RHO D IMMUNE GLOBULIN 1500 UNIT/2ML IJ SOLN
300.0000 ug | Freq: Once | INTRAMUSCULAR | Status: AC
  Administered 2010-07-25: 300 ug via INTRAMUSCULAR

## 2010-07-25 NOTE — Progress Notes (Signed)
Subjective: Postpartum Day 1: Cesarean Delivery Patient reports tolerating PO.  Denies dizziness or SOB  Objective: Vital signs in last 24 hours: Temp:  [97.4 F (36.3 C)-99.2 F (37.3 C)] 98.2 F (36.8 C) (07/24 0538) Pulse Rate:  [72-159] 116  (07/24 0538) Resp:  [16-24] 16  (07/24 0538) BP: (82-122)/(43-72) 94/63 mmHg (07/24 0538) SpO2:  [95 %-100 %] 97 % (07/24 0538) Weight:  [107.956 kg (238 lb)] 238 lb (107.956 kg) (07/23 1300)  Physical Exam:  General: alert, cooperative and no distress Lochia: appropriate Uterine Fundus: firm ABd CDI Foley discontinued, voiding well DVT Evaluation: No evidence of DVT seen on physical exam.   Basename 07/25/10 0510  HGB 7.5*  HCT 22.8*    Assessment/Plan: Status post Cesarean section. Doing well postoperatively.  Continue current care CBC for am  FeSo4 325.  CURTIS,CAROL G 07/25/2010, 8:35 AM

## 2010-07-25 NOTE — Progress Notes (Signed)
Assistance needed for deeper latch. Experienced mom for only 2 months with 1st child, didn't breast feed 2nd. Good latch observed with intermittent swallowing.

## 2010-07-25 NOTE — Progress Notes (Signed)
SW consult received for "babies who have drug screen sent." SW reviewed babies chart and there has not been any drug screens ordered.  Therefore, SW has screened out this referral as an error.  SW will only see if appropriate consult is ordered. 

## 2010-07-26 ENCOUNTER — Encounter (HOSPITAL_COMMUNITY): Payer: Self-pay | Admitting: *Deleted

## 2010-07-26 LAB — CBC
HCT: 21.7 % — ABNORMAL LOW (ref 36.0–46.0)
Hemoglobin: 7.2 g/dL — ABNORMAL LOW (ref 12.0–15.0)
MCV: 89.7 fL (ref 78.0–100.0)
RBC: 2.42 MIL/uL — ABNORMAL LOW (ref 3.87–5.11)
WBC: 6.7 10*3/uL (ref 4.0–10.5)

## 2010-07-26 LAB — RH IG WORKUP (INCLUDES ABO/RH)
ABO/RH(D): A NEG
Fetal Screen: NEGATIVE
Gestational Age(Wks): 39.5
Unit division: 0

## 2010-07-26 MED ORDER — IBUPROFEN 600 MG PO TABS: 600.0000 mg | ORAL_TABLET | Freq: Four times a day (QID) | ORAL | Status: DC

## 2010-07-26 MED ORDER — OXYCODONE-ACETAMINOPHEN 5-325 MG PO TABS: 1.0000 | ORAL_TABLET | ORAL | Status: DC | PRN

## 2010-07-26 MED ORDER — FERROUS SULFATE 325 (65 FE) MG PO TABS: 325.0000 mg | ORAL_TABLET | Freq: Three times a day (TID) | ORAL | Status: DC

## 2010-07-26 NOTE — Progress Notes (Signed)
Subjective: Postpartum Day 2: Cesarean Delivery Patient reports tolerating PO and + flatus.    Objective: Vital signs in last 24 hours: Temp:  [98.1 F (36.7 C)-98.4 F (36.9 C)] 98.4 F (36.9 C) (07/25 0555) Pulse Rate:  [105-111] 111  (07/25 0555) Resp:  [18-20] 18  (07/25 0555) BP: (105-110)/(69-71) 108/69 mmHg (07/25 0555)  Physical Exam:  General: alert and cooperative Lochia: appropriate Uterine Fundus: firm Incision: healing well DVT Evaluation: No evidence of DVT seen on physical exam.   Basename 07/26/10 0533 07/25/10 0510  HGB 7.2* 7.5*  HCT 21.7* 22.8*    Assessment/Plan: Status post Cesarean section. Doing well postoperatively.  Discharge home with standard precautions and return to clinic in 2 days for staple removal   Alfredo Collymore G 07/26/2010, 9:43 AM

## 2010-07-26 NOTE — Discharge Summary (Signed)
Obstetric Discharge Summary Reason for Admission: cesarean section Prenatal Procedures: none Intrapartum Procedures: cesarean: low cervical, transverse and tubal ligation Postpartum Procedures: none Complications-Operative and Postpartum: none  Hemoglobin  Date Value Range Status  07/26/2010 7.2* 12.0-15.0 (g/dL) Final     HCT  Date Value Range Status  07/26/2010 21.7* 36.0-46.0 (%) Final    Discharge Diagnoses: Term Pregnancy-delivered  Discharge Information: Date: 07/26/2010 Activity: pelvic rest Diet: routine Medications: PNV, Ibuprophen and Percocet Condition: stable Instructions: refer to practice specific booklet Discharge to: home   Newborn Data: Live born  Information for the patient's newborn:  Areya, Lemmerman [[id number]]  female ; APGAR , ; weight ;  Home with mother.  CURTIS,CAROL G 07/26/2010, 9:48 AM

## 2010-07-31 ENCOUNTER — Inpatient Hospital Stay (HOSPITAL_COMMUNITY)
Admission: AD | Admit: 2010-07-31 | Discharge: 2010-08-01 | Disposition: A | Discharge status: Home / Self Care | Payer: BC Managed Care – PPO | Source: Ambulatory Visit | Attending: Obstetrics and Gynecology | Admitting: Obstetrics and Gynecology

## 2010-07-31 DIAGNOSIS — Z87898 Personal history of other specified conditions: Secondary | ICD-10-CM

## 2010-07-31 DIAGNOSIS — Z789 Other specified health status: Secondary | ICD-10-CM

## 2010-07-31 DIAGNOSIS — O909 Complication of the puerperium, unspecified: Secondary | ICD-10-CM | POA: Insufficient documentation

## 2010-07-31 NOTE — Progress Notes (Signed)
Pt reports her c/s incision has drainage with odor. C/S 07/23. Denies fever, states she doesn't feel bad just didn't know if there was supposed to be that much drainage.

## 2010-08-01 NOTE — ED Provider Notes (Signed)
History    patient is a 35 year old white female is approximately 3 days status post cesarean section. She denies fever, erythema, severe abdominal pain, or any other problems at this time. She states that she noticed some Steri-Strips are coming off of her incision. She became worried she may be getting an infection.  Chief Complaint  Patient presents with  . Drainage from Incision   HPI  OB History    Grav Para Term Preterm Abortions TAB SAB Ect Mult Living   4 2 2       2       Past Medical History  Diagnosis Date  . Hypothyroidism   . Postpartum care following cesarean delivery 07/24/2010    Past Surgical History  Procedure Date  . Cesarean section '05 '07    Family History  Problem Relation Age of Onset  . Arthritis Father     History  Substance Use Topics  . Smoking status: Never Smoker   . Smokeless tobacco: Never Used  . Alcohol Use: No    Allergies:  Allergies  Allergen Reactions  . Sulfa Antibiotics Nausea And Vomiting and Other (See Comments)    headache  . Penicillins Rash    Prescriptions prior to admission  Medication Sig Dispense Refill  . ferrous sulfate 325 (65 FE) MG tablet Take 325 mg by mouth 3 (three) times daily with meals.        Marland Kitchen ibuprofen (ADVIL,MOTRIN) 600 MG tablet Take 600 mg by mouth every 6 (six) hours. pain       . levothyroxine (SYNTHROID, LEVOTHROID) 100 MCG tablet Take 100 mcg by mouth daily.       Marland Kitchen oxyCODONE-acetaminophen (PERCOCET) 5-325 MG per tablet Take 1 tablet by mouth every 8 (eight) hours as needed. pain       . Prenatal Vit-Fe Psac Cmplx-FA (PRENATAL MULTIVITAMIN) 60-1 MG tablet Take 1 tablet by mouth at bedtime.         Review of Systems  Constitutional: Negative for fever and chills.  Gastrointestinal: Negative for nausea, vomiting, abdominal pain, diarrhea and constipation.  Genitourinary: Negative for dysuria, urgency, frequency, hematuria and flank pain.  Neurological: Negative for dizziness and headaches.    Psychiatric/Behavioral: Negative for depression and suicidal ideas.   Physical Exam   Blood pressure 132/83, pulse 98, temperature 98.8 F (37.1 C), temperature source Oral, resp. rate 18, height 5\' 4"  (1.626 m), weight 229 lb (103.874 kg).  Physical Exam  Constitutional: She is oriented to person, place, and time. She appears well-developed and well-nourished. No distress.  HENT:  Head: Normocephalic and atraumatic.  GI: Soft. She exhibits no distension and no mass. There is no tenderness. There is no rebound and no guarding.       Abdominal incision appears to be healing nicely. It does appear that she had a little bit of moisture in the midline of her incision and Steri-Strips became wet and came off the incision. There is no erythema, edema or purulent drainage noted. No sign of infection.  Neurological: She is alert and oriented to person, place, and time.  Skin: Skin is warm and dry. She is not diaphoretic.  Psychiatric: She has a normal mood and affect. Her behavior is normal. Judgment and thought content normal.    MAU Course  Procedures    Assessment and Plan  Patient is doing well and will followup at her scheduled appointment in later this week. I did discuss with her appropriate wound care instructions and precautions. Return immediately she  has any worsening symptoms. She understood this and agreed.  Clinton Gallant. Rice III, DrHSc, MPAS, PA-C 08/01/2010, 12:07 AM   Henrietta Hoover, PA 08/01/10 1478

## 2010-08-18 ENCOUNTER — Ambulatory Visit (HOSPITAL_COMMUNITY)
Admission: RE | Admit: 2010-08-18 | Discharge: 2010-08-18 | Disposition: A | Discharge status: Home / Self Care | Payer: BC Managed Care – PPO | Source: Ambulatory Visit | Attending: Obstetrics and Gynecology | Admitting: Obstetrics and Gynecology

## 2010-08-18 NOTE — Progress Notes (Deleted)
Infant Lactation Consultation Outpatient Visit Note  Patient Name: [name] Date of Birth: 10/05/1975 Birth Weight:   Gestational Age at Delivery: Gestational Age: <None> Type of Delivery:   Breastfeeding History Frequency of Breastfeeding: q1-1 1/2 hours Length of Feeding: 45 min Voids: 8-10 Stools: 10  Supplementing / Method: Pumping:  Type of Pump:PIS   Frequency: once per day  Volume:  1-2 oz  Comments:    Consultation Evaluation:  Initial Feeding Assessment: Pre-feed Weight 8-6.3     3806g Post-feed Weight:8-7.0    3826g Amount Transferred:20 cc Comments:Baby nursed on Left side for 30 minutes with much stimulation needed to keep baby awake and nursing.   Additional Feeding Assessment: Pre-feed Weight:8-7.0     382g6 Post-feed Weight: 8- 7.7 3846g Amount Transferred: 20 cc Comments:Baby nursed on right side for 20 minutes with much stimulation needed to keep baby awake and nursing.   Additional Feeding Assessment: Pre-feed Weight: Post-feed Weight: Amount Transferred: Comments:  Total Breast milk Transferred this Visit: 40 cc Total Supplement Given:   Additional Interventions:  Mom's nipples are intact but pink. And she complains of pain with most feedings. Assisted with deeper latch which Mom reports feels somewhat better. Mom reports that baby nurses very frequently but goes to sleep after a few minutes at the breast. Reviewed awakening techniques. Comfort gels and SN shells given to Mom with instructions for increased comfort. Baby is not able to extend tongue over bottom gum. Has appointment with Dr, Jenne Pane on Tuesday to determine if frenulum needs to be clipped. FOB also had tight frenulum clipped at age 42.Breasts are not feeling very full because baby is nursing so often. Encouraged Mom to pump 4 times per day to promote supply and may try Fenugreek to increase milk supply. No questions at present. Suggested BF support group for support and to follow  weight.   Follow-Up      [name] 08/18/2010, 5:57 PM

## 2010-08-18 NOTE — Progress Notes (Signed)
Infant Lactation Consultation Outpatient Visit Note  Patient Name: [name] Date of Birth: October 25, 1975 Birth Weight:   Gestational Age at Delivery: Gestational Age: <None> Type of Delivery:   Breastfeeding History Frequency of Breastfeeding: q1-1 1/2 hours Length of Feeding: 45 min Voids: 8-10 Stools: 10  Supplementing / Method: Pumping:  Type of Pump:PIS   Frequency: once per day  Volume:  1-2 oz  Comments:    Consultation Evaluation:  Initial Feeding Assessment: Pre-feed Weight 8-6.3     3806g Post-feed Weight:8-7.0    3826g Amount Transferred:20 cc Comments:Baby nursed on Left side for 30 minutes with much stimulation needed to keep baby awake and nursing.   Additional Feeding Assessment: Pre-feed Weight:8-7.0     382g6 Post-feed Weight: 8- 7.7 3846g Amount Transferred: 20 cc Comments:Baby nursed on right side for 20 minutes with much stimulation needed to keep baby awake and nursing.   Additional Feeding Assessment: Pre-feed Weight: Post-feed Weight: Amount Transferred: Comments:  Total Breast milk Transferred this Visit: 40 cc Total Supplement Given:   Additional Interventions:  Mom's nipples are intact but pink. And she complains of pain with most feedings. Assisted with deeper latch which Mom reports feels somewhat better. Mom reports that baby nurses very frequently but goes to sleep after a few minutes at the breast. Reviewed awakening techniques. Comfort gels and SN shells given to Mom with instructions for increased comfort. Baby is not able to extend tongue over bottom gum. Has appointment with Dr, Jenne Pane on Tuesday to determine if frenulum needs to be clipped. FOB also had tight frenulum clipped at age 77.Breasts are not feeling very full because baby is nursing so often. Encouraged Mom to pump 4 times per day to promote supply and may try Fenugreek to increase milk supply. No questions at present. Suggested BF support group for support and to follow  weight.   Follow-Up      [name] 08/18/2010, 6:00 PM

## 2010-08-18 NOTE — Progress Notes (Signed)
Infant Lactation Consultation Outpatient Visit Note  Patient Name: [name] Date of Birth: 22-Jan-1975 Birth Weight:   Gestational Age at Delivery: Gestational Age: <None> Type of Delivery:   Breastfeeding History Frequency of Breastfeeding: q1-1 1/2 hours Length of Feeding: 45 min Voids: 8-10 Stools: 10  Supplementing / Method: Pumping:  Type of Pump:PIS   Frequency: once per day  Volume:  1-2 oz  Comments:    Consultation Evaluation:  Initial Feeding Assessment: Pre-feed Weight 8-6.3     3806g Post-feed Weight:8-7.0    3826g Amount Transferred:20 cc Comments:Baby nursed on Left side for 30 minutes with much stimulation needed to keep baby awake and nursing.   Additional Feeding Assessment: Pre-feed Weight:8-7.0     382g6 Post-feed Weight: 8- 7.7 3846g Amount Transferred: 20 cc Comments:Baby nursed on right side for 20 minutes with much stimulation needed to keep baby awake and nursing.   Additional Feeding Assessment: Pre-feed Weight: Post-feed Weight: Amount Transferred: Comments:  Total Breast milk Transferred this Visit: 40 cc Total Supplement Given:   Additional Interventions:  Mom's nipples are intact but pink. And she complains of pain with most feedings. Assisted with deeper latch which Mom reports feels somewhat better. Mom reports that baby nurses very frequently but goes to sleep after a few minutes at the breast. Reviewed awakening techniques. Comfort gels and SN shells given to Mom with instructions for increased comfort. Baby is not able to extend tongue over bottom gum. Has appointment with Dr, Jenne Pane on Tuesday to determine if frenulum needs to be clipped. FOB also had tight frenulum clipped at age 89.Breasts are not feeling very full because baby is nursing so often. Encouraged Mom to pump 4 times per day to promote supply and may try Fenugreek to increase milk supply. No questions at present. Suggested BF support group for support and to follow  weight.   Follow-Up      [name] 08/18/2010, 5:44 PM

## 2010-09-15 ENCOUNTER — Encounter (HOSPITAL_COMMUNITY): Payer: Self-pay | Admitting: *Deleted

## 2011-01-09 ENCOUNTER — Other Ambulatory Visit: Payer: Self-pay | Admitting: Endocrinology

## 2011-01-09 DIAGNOSIS — E049 Nontoxic goiter, unspecified: Secondary | ICD-10-CM

## 2011-02-12 ENCOUNTER — Other Ambulatory Visit: Payer: Self-pay | Admitting: Urology

## 2011-02-26 ENCOUNTER — Encounter (HOSPITAL_COMMUNITY): Payer: Self-pay | Admitting: *Deleted

## 2011-02-26 NOTE — Progress Notes (Signed)
Reminded to take laxitive 03/04/11 and no aspirin products 4 days prior to ESWL

## 2011-03-01 ENCOUNTER — Encounter (HOSPITAL_COMMUNITY): Payer: Self-pay | Admitting: Pharmacy Technician

## 2011-03-05 ENCOUNTER — Encounter (HOSPITAL_COMMUNITY)
Admission: RE | Disposition: A | Discharge status: Home / Self Care | Payer: Self-pay | Source: Ambulatory Visit | Attending: Urology

## 2011-03-05 ENCOUNTER — Ambulatory Visit (HOSPITAL_COMMUNITY): Payer: BC Managed Care – PPO

## 2011-03-05 ENCOUNTER — Other Ambulatory Visit: Payer: BC Managed Care – PPO

## 2011-03-05 ENCOUNTER — Encounter (HOSPITAL_COMMUNITY): Payer: Self-pay | Admitting: *Deleted

## 2011-03-05 ENCOUNTER — Ambulatory Visit (HOSPITAL_COMMUNITY)
Admission: RE | Admit: 2011-03-05 | Discharge: 2011-03-05 | Disposition: A | Discharge status: Home / Self Care | Payer: BC Managed Care – PPO | Source: Ambulatory Visit | Attending: Urology | Admitting: Urology

## 2011-03-05 DIAGNOSIS — Z538 Procedure and treatment not carried out for other reasons: Secondary | ICD-10-CM | POA: Insufficient documentation

## 2011-03-05 DIAGNOSIS — N201 Calculus of ureter: Secondary | ICD-10-CM | POA: Insufficient documentation

## 2011-03-05 HISTORY — DX: Calculus of kidney: N20.0

## 2011-03-05 SURGERY — LITHOTRIPSY, ESWL
Anesthesia: LOCAL | Laterality: Left

## 2011-03-05 MED ORDER — DIAZEPAM 5 MG PO TABS
10.0000 mg | ORAL_TABLET | ORAL | Status: AC
  Administered 2011-03-05: 10 mg via ORAL

## 2011-03-05 MED ORDER — CIPROFLOXACIN HCL 500 MG PO TABS
500.0000 mg | ORAL_TABLET | ORAL | Status: AC
  Administered 2011-03-05: 500 mg via ORAL

## 2011-03-05 MED ORDER — DIPHENHYDRAMINE HCL 25 MG PO CAPS
25.0000 mg | ORAL_CAPSULE | ORAL | Status: AC
  Administered 2011-03-05: 25 mg via ORAL

## 2011-03-05 MED ORDER — DEXTROSE-NACL 5-0.45 % IV SOLN
INTRAVENOUS | Status: DC
  Administered 2011-03-05: 14:00:00 via INTRAVENOUS

## 2011-03-05 NOTE — Progress Notes (Signed)
Procedure cancelled as pt had passed stone at home.

## 2011-03-06 ENCOUNTER — Encounter (HOSPITAL_COMMUNITY): Payer: Self-pay

## 2011-03-07 ENCOUNTER — Other Ambulatory Visit: Payer: BC Managed Care – PPO

## 2011-03-08 ENCOUNTER — Ambulatory Visit
Admission: RE | Admit: 2011-03-08 | Discharge: 2011-03-08 | Disposition: A | Discharge status: Home / Self Care | Payer: BC Managed Care – PPO | Source: Ambulatory Visit | Attending: Endocrinology | Admitting: Endocrinology

## 2011-03-08 DIAGNOSIS — E049 Nontoxic goiter, unspecified: Secondary | ICD-10-CM

## 2012-08-15 IMAGING — US US SOFT TISSUE HEAD/NECK
1 series · 14 of 25 positions shown · non-contrast
Comparison: None

CLINICAL DATA: Thyroid goiter,  the patient is on thyroid
medication

THYROID ULTRASOUND
TECHNIQUE: Ultrasound examination of the thyroid gland and adjacent
soft tissues was performed.

[Series 1: us soft tissue head/neck · 0.07mm/px · 14 of 38 slices shown]
[im 1/38]
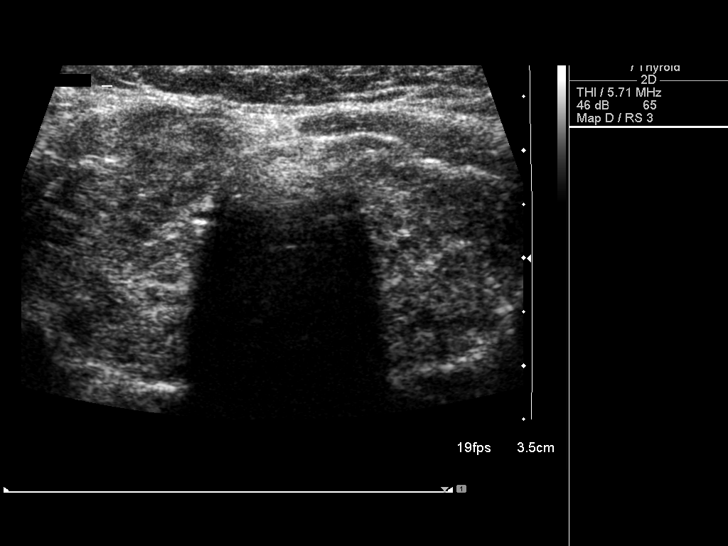
[im 4/38]
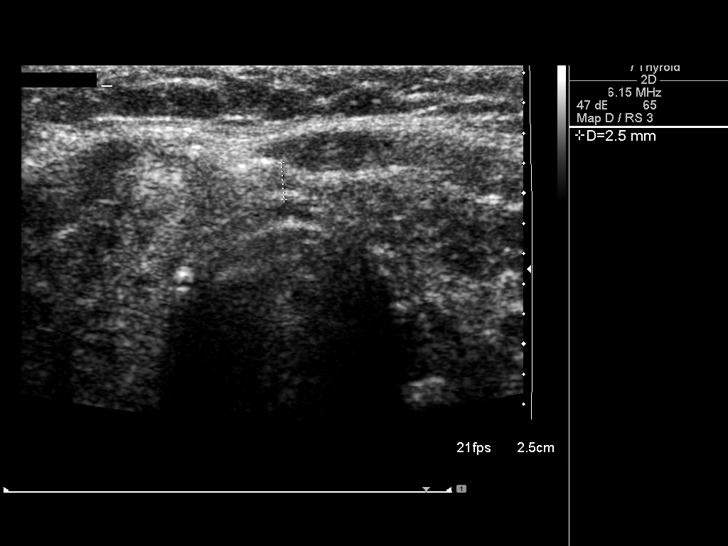
[im 7/38]
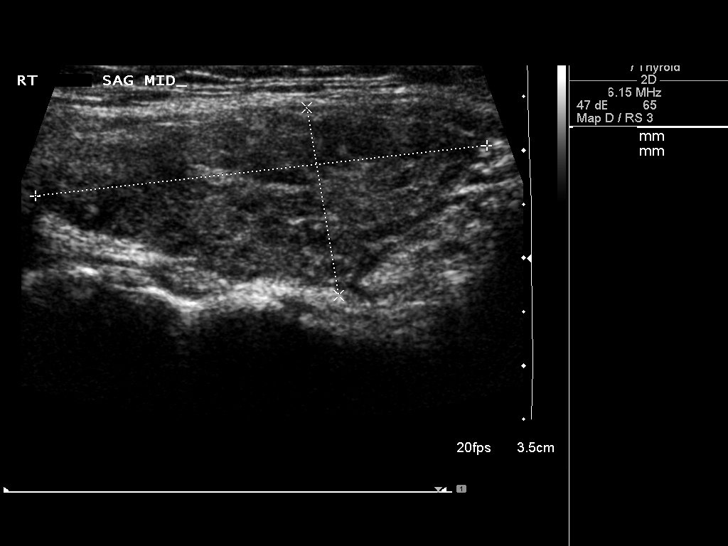
[im 10/38]
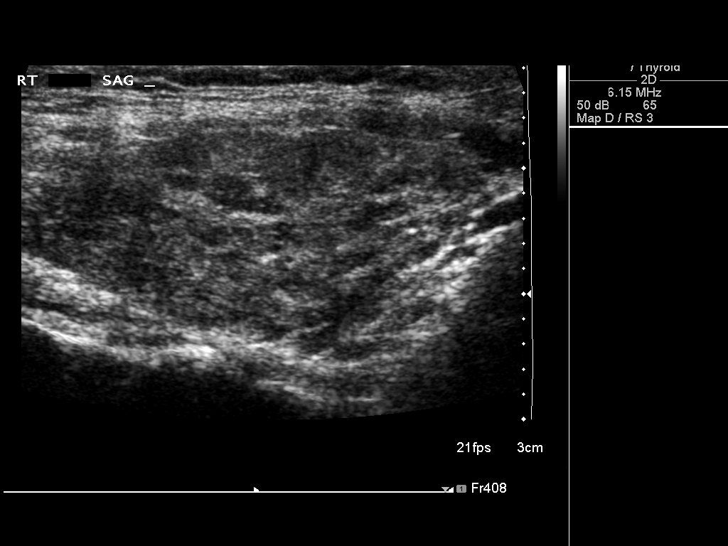
[im 13/38]
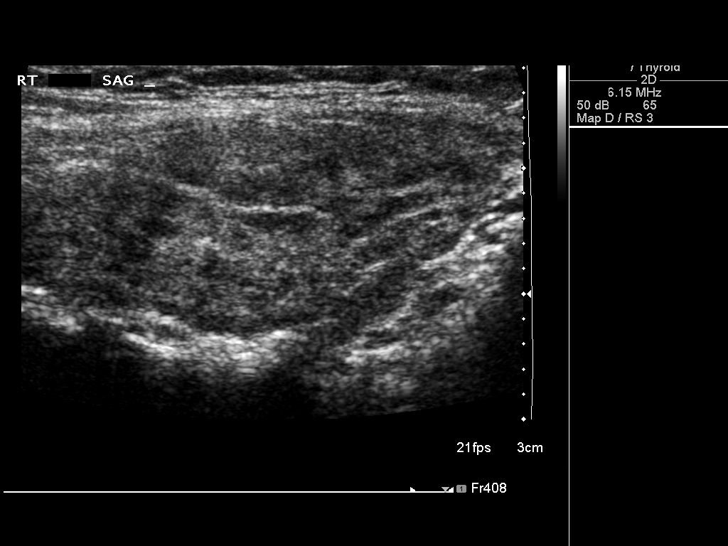
[im 14/38]
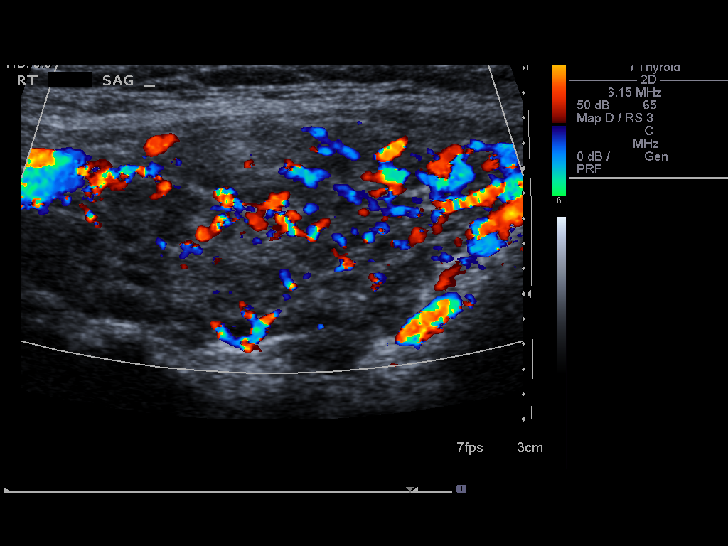
[im 17/38]
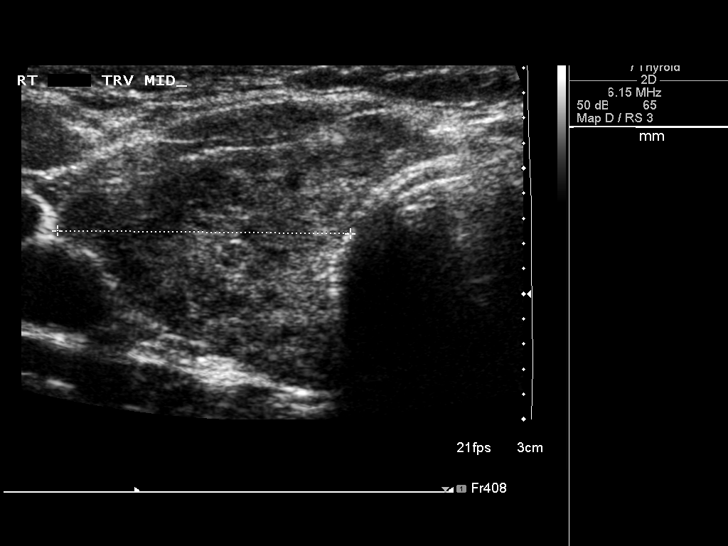
[im 21/38]
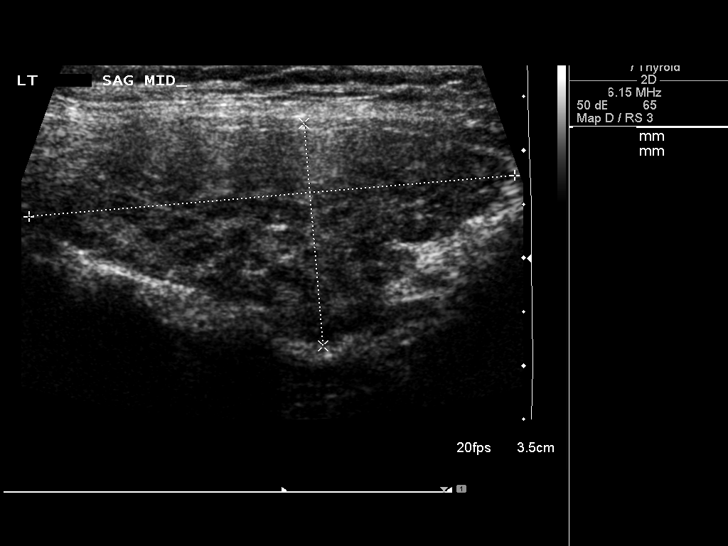
[im 24/38]
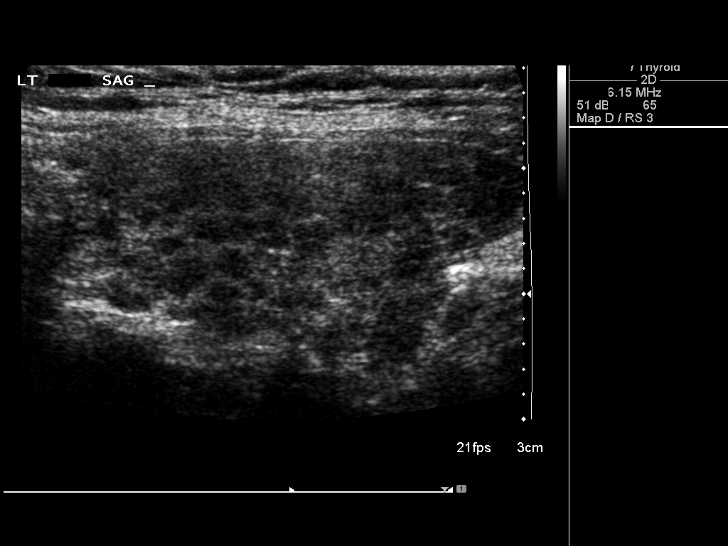
[im 25/38]
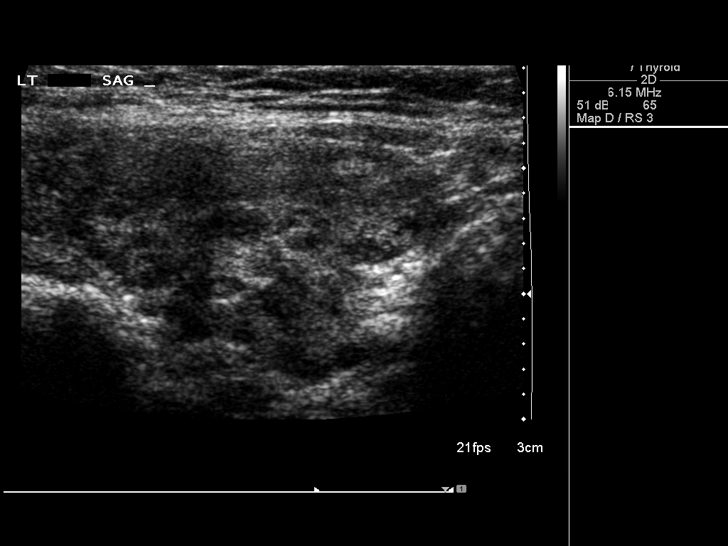
[im 28/38]
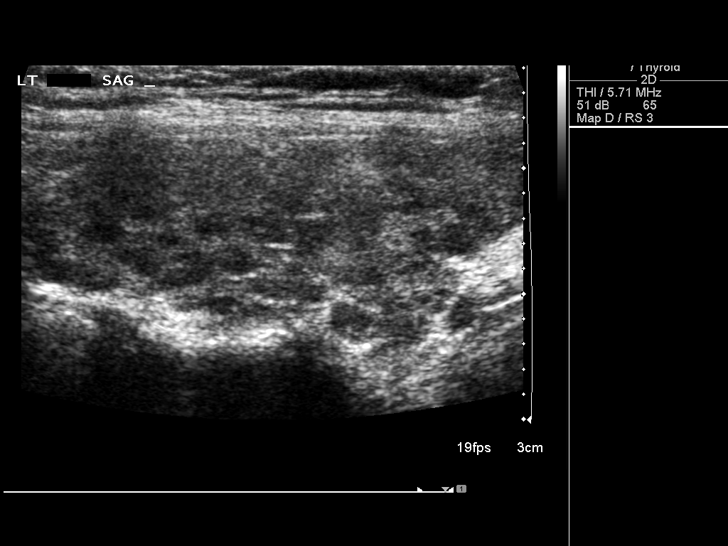
[im 31/38]
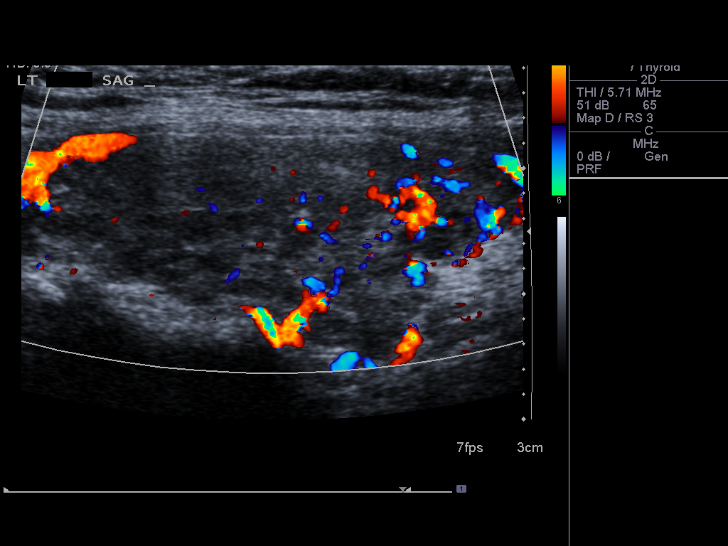
[im 34/38]
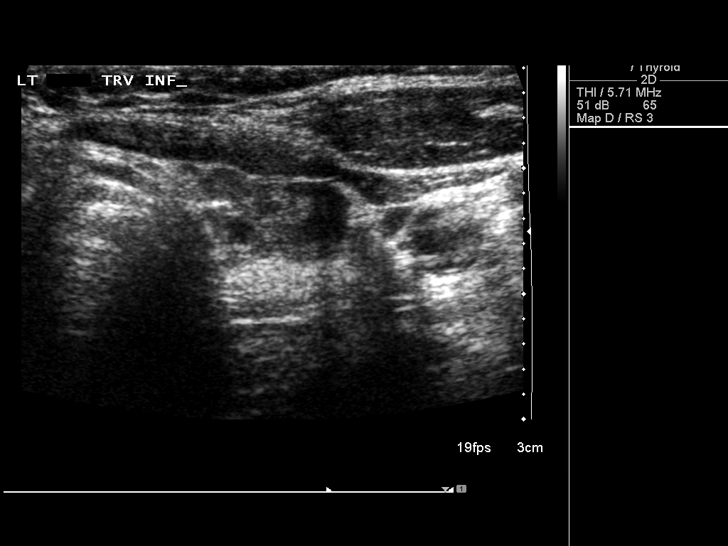
[im 38/38]
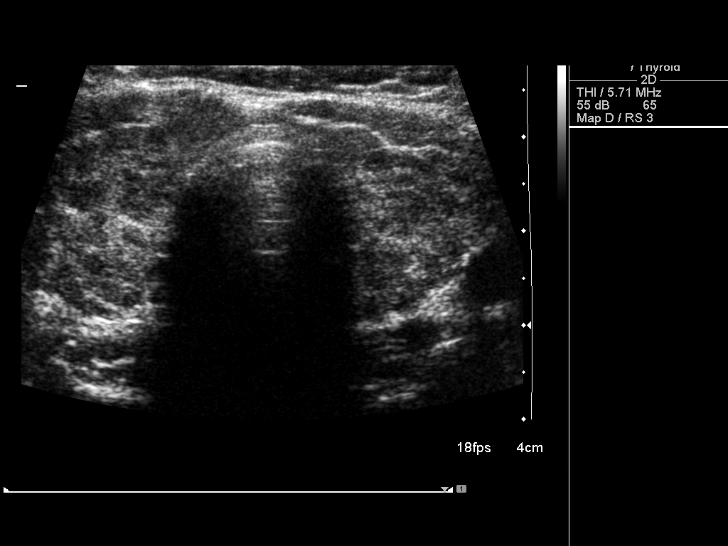

[14 of 25 positions shown; findings below may reference images not displayed]

FINDINGS: Right thyroid lobe:  4.2 x 1.8 x 2.3 cm.
Left thyroid lobe:  4.5 x 2.1 x 1.8 cm.
Isthmus:  3 mm in thickness.

Focal nodules:  The echogenicity of the thyroid gland is
inhomogeneous diffusely.  No solid or cystic nodule is seen.

Lymphadenopathy:  No adenopathy is noted.
IMPRESSION: The thyroid gland is inhomogeneous diffusely, but no solid or
cystic nodule is seen.

## 2012-09-24 ENCOUNTER — Other Ambulatory Visit: Payer: Self-pay | Admitting: Endocrinology

## 2012-09-24 DIAGNOSIS — E049 Nontoxic goiter, unspecified: Secondary | ICD-10-CM

## 2012-09-30 ENCOUNTER — Ambulatory Visit
Admission: RE | Admit: 2012-09-30 | Discharge: 2012-09-30 | Disposition: A | Discharge status: Home / Self Care | Payer: BC Managed Care – PPO | Source: Ambulatory Visit | Attending: Endocrinology | Admitting: Endocrinology

## 2012-09-30 DIAGNOSIS — E049 Nontoxic goiter, unspecified: Secondary | ICD-10-CM

## 2013-02-24 ENCOUNTER — Encounter: Payer: Self-pay | Admitting: *Deleted

## 2013-02-25 ENCOUNTER — Ambulatory Visit: Payer: Self-pay | Admitting: *Deleted

## 2013-02-25 ENCOUNTER — Ambulatory Visit: Payer: BC Managed Care – PPO | Admitting: *Deleted

## 2013-02-25 ENCOUNTER — Ambulatory Visit (INDEPENDENT_AMBULATORY_CARE_PROVIDER_SITE_OTHER): Payer: Self-pay

## 2013-02-25 DIAGNOSIS — I781 Nevus, non-neoplastic: Secondary | ICD-10-CM

## 2013-02-25 NOTE — Progress Notes (Signed)
X=.5% Asclera administered with a 27g butterfly.  Patient received a total of 5cc.  Cutaneous Laser:pulsed mode  810j/cm2  400ms delay  13ms duration  0.5 spot Total energy: 779  Total pulses: 492  Total time: :06  Photos: yes  Compression stockings applied: yes  Treated all vessels that bother her. Anticipate good results. Tol well. Follow prn.

## 2013-02-26 ENCOUNTER — Encounter: Payer: Self-pay | Admitting: Vascular Surgery

## 2013-11-02 ENCOUNTER — Encounter (HOSPITAL_COMMUNITY): Payer: Self-pay | Admitting: *Deleted

## 2014-02-17 ENCOUNTER — Ambulatory Visit (HOSPITAL_COMMUNITY): Payer: Self-pay | Admitting: Psychology

## 2014-03-10 IMAGING — US US SOFT TISSUE HEAD/NECK
1 series · 14 of 25 positions shown · non-contrast
Comparison: 03/08/2011

CLINICAL DATA: Goiter.

EXAM:
THYROID ULTRASOUND
TECHNIQUE: Ultrasound examination of the thyroid gland and adjacent soft
tissues was performed.

[Series 1: us soft tissue head/neck · 0.06mm/px · 14 of 41 slices shown]
[im 1/41]
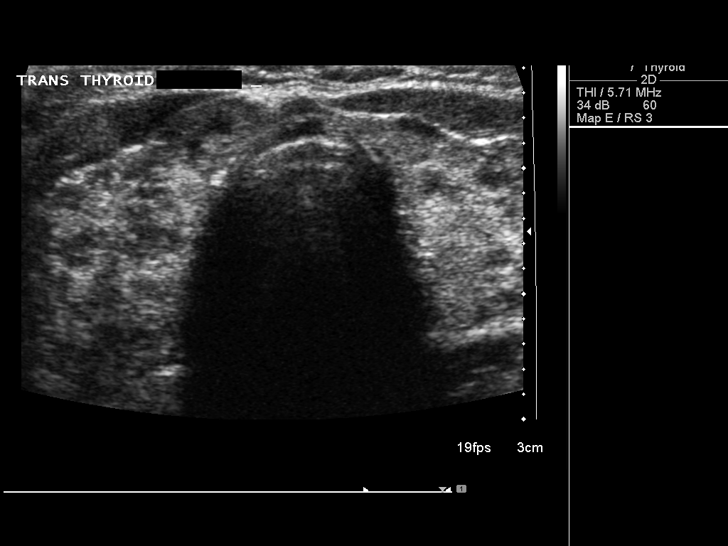
[im 4/41]
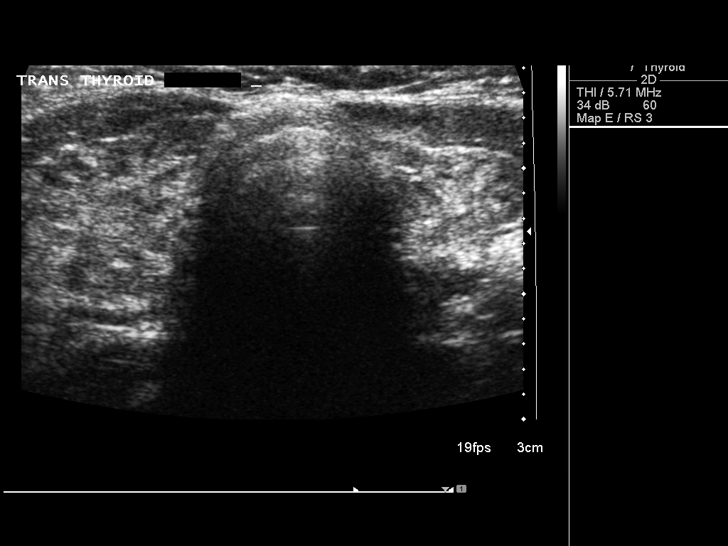
[im 7/41]
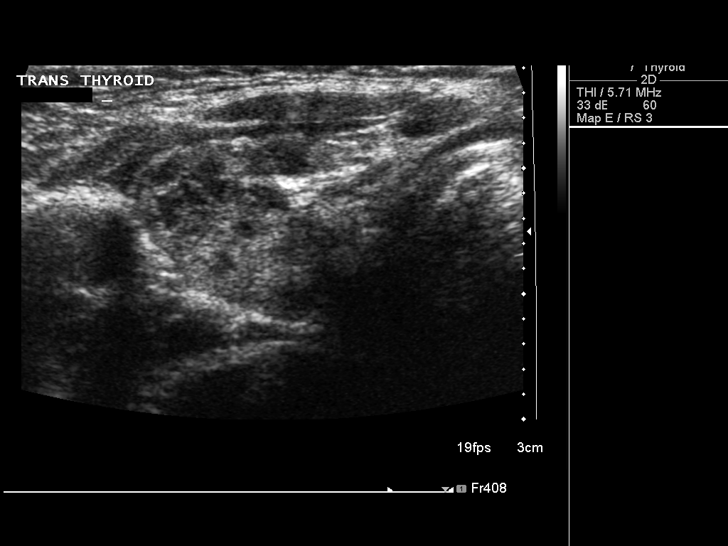
[im 11/41]
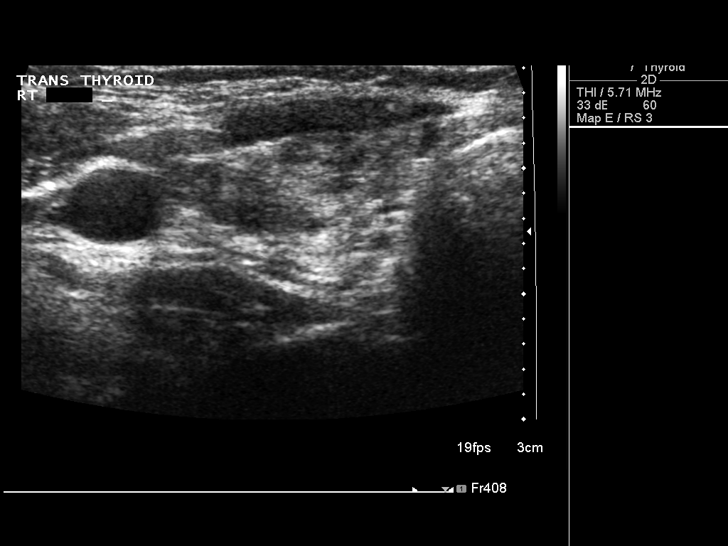
[im 14/41]
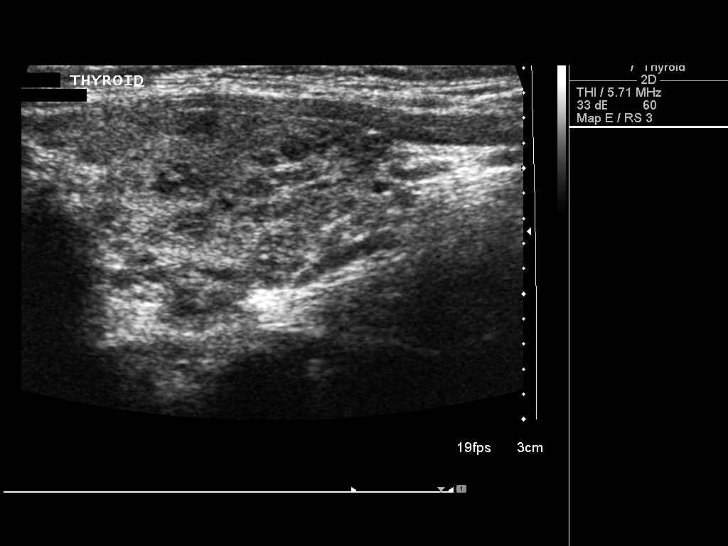
[im 16/41]
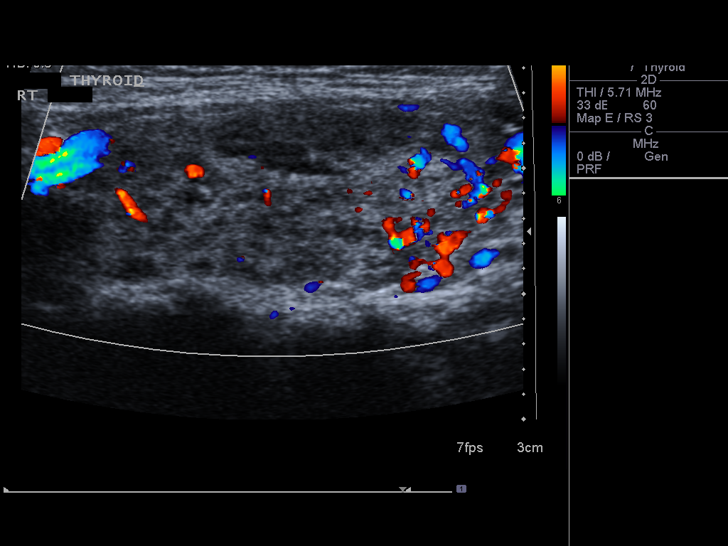
[im 19/41]
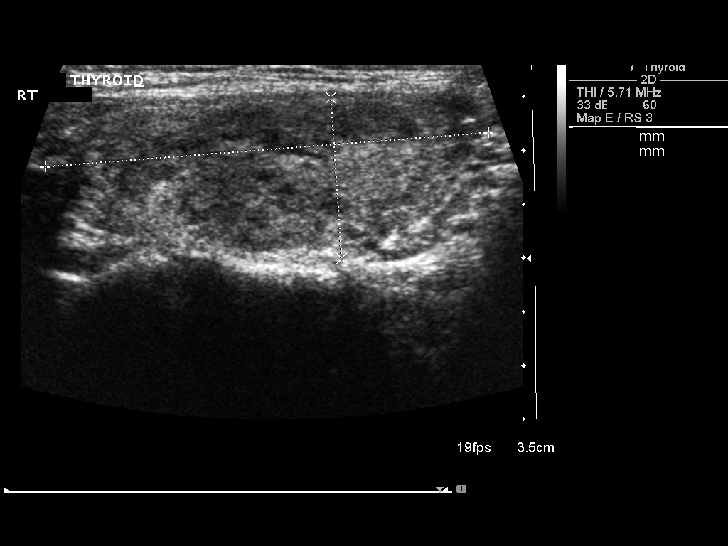
[im 22/41]
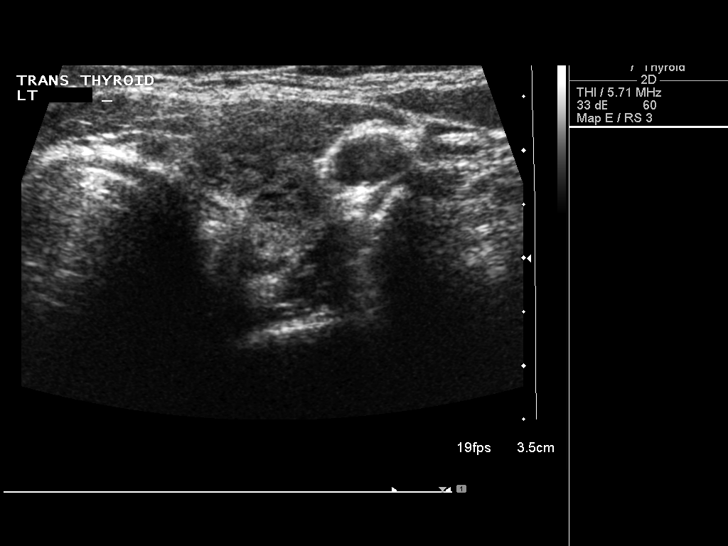
[im 26/41]
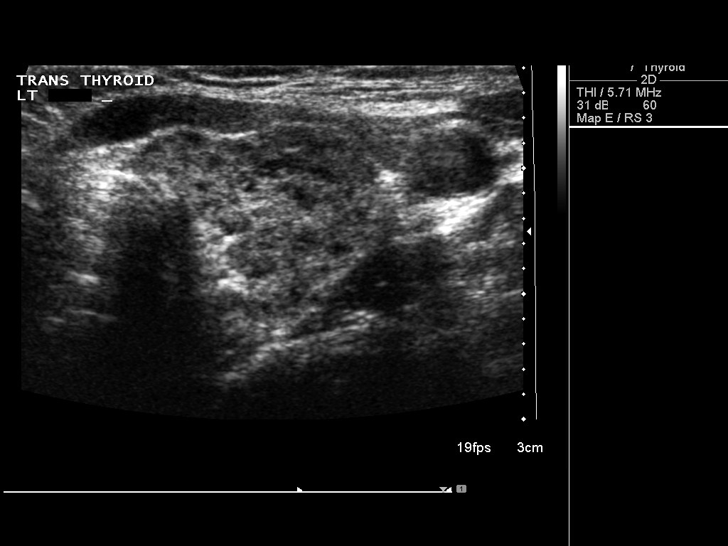
[im 27/41]
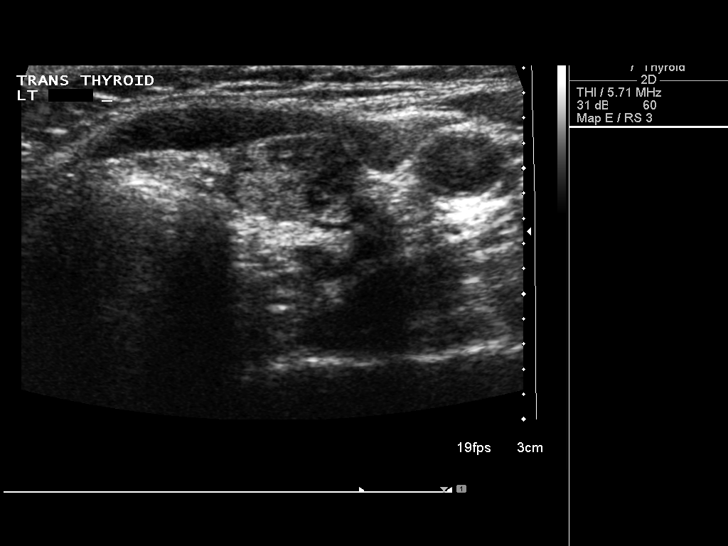
[im 31/41]
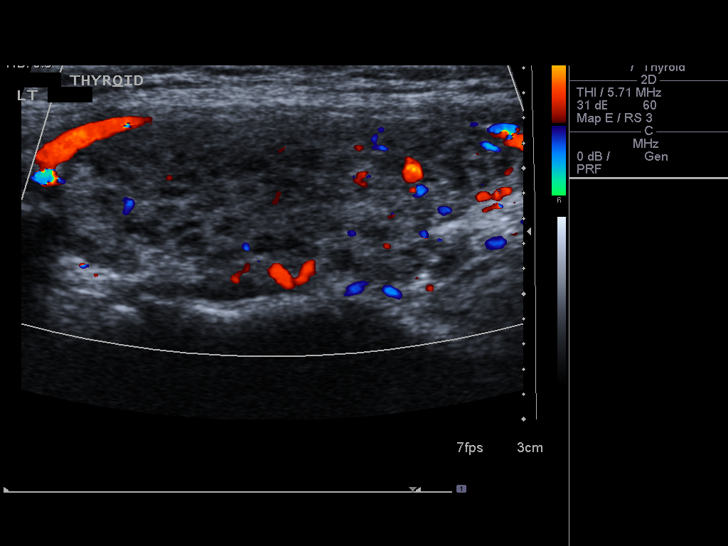
[im 34/41]
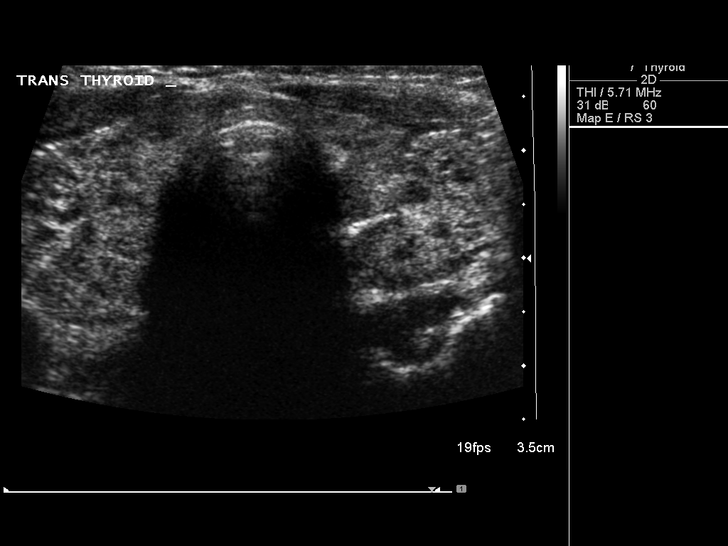
[im 37/41]
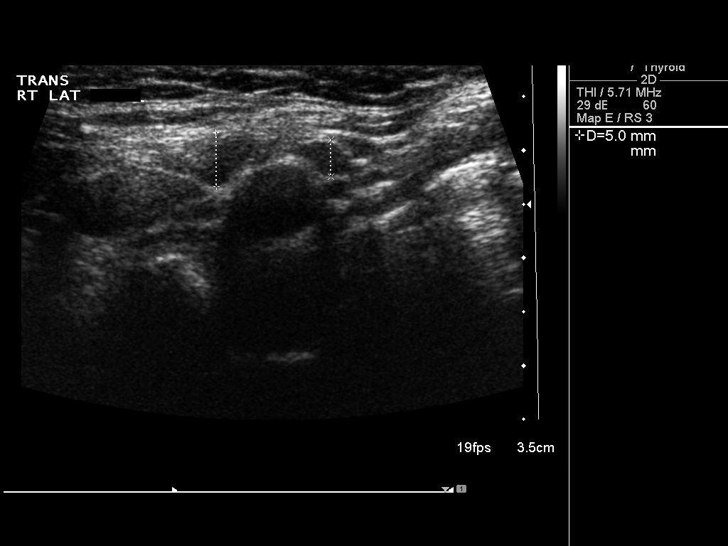
[im 41/41]
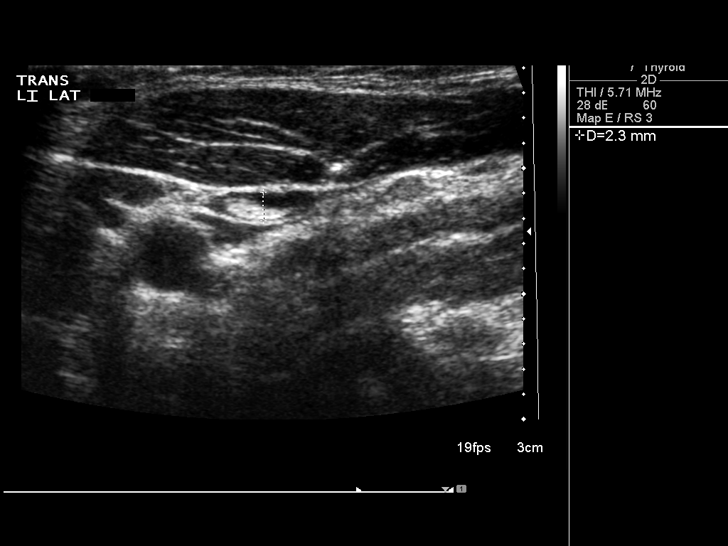

[14 of 25 positions shown; findings below may reference images not displayed]

FINDINGS: Right thyroid lobe

Measurements: 41 x 15 x 25 mm, diffusely inhomogeneous. No nodules
visualized.

Left thyroid lobe

Measurements: 42 x 16 x 18 mm, diffusely inhomogeneous.. No nodules
visualized.

Isthmus

Thickness: 2 mm.  No nodules visualized.

Lymphadenopathy

None visualized.
IMPRESSION: Normal-sized thyroid with inhomogeneous parenchyma, no focal nodule
or other lesion.

## 2014-03-11 ENCOUNTER — Ambulatory Visit (INDEPENDENT_AMBULATORY_CARE_PROVIDER_SITE_OTHER): Payer: BLUE CROSS/BLUE SHIELD | Admitting: Psychology

## 2014-04-15 ENCOUNTER — Ambulatory Visit (INDEPENDENT_AMBULATORY_CARE_PROVIDER_SITE_OTHER): Payer: BLUE CROSS/BLUE SHIELD | Admitting: Psychology

## 2014-05-05 ENCOUNTER — Other Ambulatory Visit (HOSPITAL_COMMUNITY): Payer: Self-pay | Admitting: Obstetrics and Gynecology

## 2014-05-06 ENCOUNTER — Ambulatory Visit (INDEPENDENT_AMBULATORY_CARE_PROVIDER_SITE_OTHER): Payer: BLUE CROSS/BLUE SHIELD | Admitting: Psychology

## 2014-05-06 LAB — CYTOLOGY - PAP

## 2014-07-01 ENCOUNTER — Other Ambulatory Visit: Payer: Self-pay | Admitting: Obstetrics and Gynecology

## 2015-03-11 ENCOUNTER — Encounter: Payer: Self-pay | Admitting: *Deleted

## 2015-03-16 ENCOUNTER — Encounter: Payer: Self-pay | Admitting: *Deleted

## 2015-03-16 ENCOUNTER — Ambulatory Visit: Payer: Self-pay | Admitting: *Deleted

## 2015-03-16 ENCOUNTER — Ambulatory Visit (INDEPENDENT_AMBULATORY_CARE_PROVIDER_SITE_OTHER): Payer: BLUE CROSS/BLUE SHIELD | Admitting: *Deleted

## 2015-03-16 DIAGNOSIS — I8393 Asymptomatic varicose veins of bilateral lower extremities: Secondary | ICD-10-CM

## 2015-03-16 DIAGNOSIS — I781 Nevus, non-neoplastic: Secondary | ICD-10-CM

## 2015-03-16 HISTORY — DX: Nevus, non-neoplastic: I78.1

## 2015-03-16 NOTE — Progress Notes (Signed)
X=.3% Sotradecol administered with a 27g butterfly.  Patient received a total of 6cc.  Treated all areas of concern (retics and spiders). Easy access. Tol well. Will follow prn.  Photos: No.  Compression stockings applied: Yes.

## 2015-04-14 ENCOUNTER — Ambulatory Visit: Payer: Self-pay | Admitting: *Deleted

## 2015-05-04 ENCOUNTER — Ambulatory Visit: Payer: Self-pay | Admitting: *Deleted

## 2016-03-09 ENCOUNTER — Ambulatory Visit (INDEPENDENT_AMBULATORY_CARE_PROVIDER_SITE_OTHER): Payer: Self-pay | Admitting: *Deleted

## 2016-03-09 DIAGNOSIS — I781 Nevus, non-neoplastic: Secondary | ICD-10-CM

## 2016-03-09 NOTE — Progress Notes (Signed)
X=.3% Sotradecol administered with a 27g butterfly.  Patient received a total of 4.5cc.  Cutaneous Laser:pulsed mode  810j/cm2 400 ms delay  13 ms Duration 0.5 spot  Total pulses: 399 back/293 front Total energy 631.75 back/463 front  Total time::08  She has had a good result from her last tx. Reticulars are gone. Cleaned up spiders with a combo of sclero and CL. Easy access. Tol well. Will follow this very nice lady prn.    Compression stockings applied: Yes.

## 2016-04-04 ENCOUNTER — Ambulatory Visit: Payer: Self-pay | Admitting: *Deleted

## 2016-04-11 ENCOUNTER — Ambulatory Visit: Payer: Self-pay

## 2017-08-03 ENCOUNTER — Encounter (HOSPITAL_COMMUNITY): Payer: Self-pay | Admitting: Emergency Medicine

## 2017-08-03 ENCOUNTER — Ambulatory Visit (HOSPITAL_COMMUNITY)
Admission: EM | Admit: 2017-08-03 | Discharge: 2017-08-03 | Disposition: A | Discharge status: Home / Self Care | Payer: 59 | Attending: Family Medicine | Admitting: Family Medicine

## 2017-08-03 DIAGNOSIS — E039 Hypothyroidism, unspecified: Secondary | ICD-10-CM | POA: Diagnosis not present

## 2017-08-03 DIAGNOSIS — J029 Acute pharyngitis, unspecified: Secondary | ICD-10-CM | POA: Diagnosis present

## 2017-08-03 DIAGNOSIS — Z87442 Personal history of urinary calculi: Secondary | ICD-10-CM | POA: Diagnosis not present

## 2017-08-03 MED ORDER — AMOXICILLIN 875 MG PO TABS: 875.0000 mg | ORAL_TABLET | Freq: Two times a day (BID) | ORAL | 0 refills | Status: AC

## 2017-08-03 NOTE — ED Triage Notes (Signed)
Pt c/o fever, sore throat, body aches for several days, had 100.2 fever at home.

## 2017-08-03 NOTE — ED Provider Notes (Signed)
[facility]   [id number] 08/03/17 Arrival Time: 1233  ASSESSMENT & PLAN:  1. Sore throat    With fever, suspicion for strep. Culture sent.  Meds ordered this encounter  Medications  . amoxicillin (AMOXIL) 875 MG tablet    Sig: Take 1 tablet (875 mg total) by mouth 2 (two) times daily for 10 days.    Dispense:  20 tablet    Refill:  0    OTC analgesics and throat care as needed  Instructed to finish full 10 day course of antibiotics. Will follow up if not showing significant improvement over the next 24-48 hours.  Reviewed expectations re: course of current medical issues. Questions answered. Outlined signs and symptoms indicating need for more acute intervention. Patient verbalized understanding. After Visit Summary given.   SUBJECTIVE:  [name] is a 42 y.o. female who reports a sore throat. Describes as pain with swallowing. Onset gradual beginning 2 days ago. No respiratory symptoms. Normal PO intake but reports discomfort with swallowing. Fever reported: yes. No associated n/v/abdominal symptoms. 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  OTC treatment: none reported.  ROS: As per HPI.   OBJECTIVE:  Vitals:   08/03/17 1351  BP: 129/82  Pulse: (!) 102  Resp: 16  Temp: 99.4 F (37.4 C)  SpO2: 100%     General appearance: alert; no distress HEENT: throat with tonsillar hypertrophy (L>R) and moderate erythema; uvula midline yes Neck: supple with FROM; no lymphadenopathy Lungs: clear to auscultation bilaterally Skin: reveals no rash; warm and dry Psychological: alert and cooperative; normal mood and affect  Allergies  Allergen Reactions  . Sulfa Antibiotics Nausea And Vomiting and Other (See Comments)    headache  . Penicillins Rash    Past Medical History:  Diagnosis Date  . Hypothyroidism   . Kidney stones    left ureter stone  . Postpartum care following cesarean delivery 07/24/2010  . Spider veins 03/16/2015   Social  History   Socioeconomic History  . Marital status: Married    Spouse name: Not on file  . Number of children: Not on file  . Years of education: Not on file  . Highest education level: Not on file  Occupational History  . Not on file  Social Needs  . Financial resource strain: Not on file  . Food insecurity:    Worry: Not on file    Inability: Not on file  . Transportation needs:    Medical: Not on file    Non-medical: Not on file  Tobacco Use  . Smoking status: Never Smoker  . Smokeless tobacco: Never Used  Substance and Sexual Activity  . Alcohol use: No  . Drug use: No  . Sexual activity: Yes    Birth control/protection: IUD, Surgical    Comment: IUD prior to pregnancy  Lifestyle  . Physical activity:    Days per week: Not on file    Minutes per session: Not on file  . Stress: Not on file  Relationships  . Social connections:    Talks on phone: Not on file    Gets together: Not on file    Attends religious service: Not on file    Active member of club or organization: Not on file    Attends meetings of clubs or organizations: Not on file    Relationship status: Not on file  . Intimate partner violence:    Fear of current or ex partner: Not on file  Emotionally abused: Not on file    Physically abused: Not on file    Forced sexual activity: Not on file  Other Topics Concern  . Not on file  Social History Narrative  . Not on file   Family History  Problem Relation Age of Onset  . Arthritis Father           Mardella Layman, MD 08/03/17 1435

## 2017-08-06 ENCOUNTER — Telehealth (HOSPITAL_COMMUNITY): Payer: Self-pay

## 2017-08-06 LAB — CULTURE, GROUP A STREP (THRC)

## 2017-08-06 NOTE — Telephone Encounter (Signed)
Culture is positive for group A Strep germ.  Pt treated with amoxicillin at ucc visit. Attempted to reach patient. No answer at this time.

## 2017-11-19 ENCOUNTER — Other Ambulatory Visit: Payer: Self-pay

## 2017-11-19 ENCOUNTER — Encounter (HOSPITAL_COMMUNITY): Payer: Self-pay

## 2017-11-19 ENCOUNTER — Ambulatory Visit (HOSPITAL_COMMUNITY)
Admission: EM | Admit: 2017-11-19 | Discharge: 2017-11-19 | Disposition: A | Discharge status: Home / Self Care | Payer: 59 | Attending: Family Medicine | Admitting: Family Medicine

## 2017-11-19 DIAGNOSIS — R05 Cough: Secondary | ICD-10-CM

## 2017-11-19 DIAGNOSIS — R059 Cough, unspecified: Secondary | ICD-10-CM

## 2017-11-19 DIAGNOSIS — J0100 Acute maxillary sinusitis, unspecified: Secondary | ICD-10-CM

## 2017-11-19 MED ORDER — BENZONATATE 100 MG PO CAPS: 100.0000 mg | ORAL_CAPSULE | Freq: Three times a day (TID) | ORAL | 0 refills | Status: DC

## 2017-11-19 MED ORDER — DOXYCYCLINE HYCLATE 100 MG PO CAPS: 100.0000 mg | ORAL_CAPSULE | Freq: Two times a day (BID) | ORAL | 0 refills | Status: DC

## 2017-11-19 NOTE — Discharge Instructions (Addendum)
Rest and push fluids Doxycycline prescribed.  Take as directed and to completion\ Tessalon perles prescribed.  Use as needed for cough Continue with OTC ibuprofen/tylenol as needed for pain Follow up with PCP or [facility] if symptoms persists Return or go to the ED if you have any new or worsening symptoms such as fever, chills, worsening sinus pain/pressure, cough, sore throat, chest pain, shortness of breath, abdominal pain, changes in bowel or bladder habits, etc...Marland Kitchen

## 2017-11-19 NOTE — ED Provider Notes (Signed)
[facility]   [id number] 11/19/17 Arrival Time: 0803  Cc: COUGH and sinus pain  SUBJECTIVE:  [name] is a 42 y.o. female who presents with sinus pain and pressure, PND, and cough x 6 days.  Admits to positive sick exposure to children with similar symptoms. States symptoms were improving and then worsened over the past few days. Describes cough as intermittent and productive with green sputum.  Has tried dayquil and nyquil without relief.  Symptoms are made worse at night.  Reports previous symptoms in the past. Complains of chills and fatigue.  Denies fever, rhinorrhea, SOB, wheezing, chest pain, nausea, changes in bowel or bladder habits.    Patient is a Engineer, civil (consulting) at [employer].  ROS: As per HPI.  Past Medical History:  Diagnosis Date  . Hypothyroidism   . Kidney stones    left ureter stone  . Postpartum care following cesarean delivery 07/24/2010  . Spider veins 03/16/2015   Past Surgical History:  Procedure Laterality Date  . CESAREAN SECTION     x3  . TUBAL LIGATION     2012   Allergies  Allergen Reactions  . Sulfa Antibiotics Nausea And Vomiting and Other (See Comments)    headache  . Penicillins Rash   No current facility-administered medications on file prior to encounter.    Current Outpatient Medications on File Prior to Encounter  Medication Sig Dispense Refill  . lisdexamfetamine (VYVANSE) 40 MG capsule Take 40 mg by mouth every morning.    Marland Kitchen FLUoxetine (PROZAC) 20 MG capsule Take 20 mg by mouth daily.    Marland Kitchen ibuprofen (ADVIL,MOTRIN) 200 MG tablet Take 600 mg by mouth every 6 (six) hours as needed. Pain    . levothyroxine (SYNTHROID, LEVOTHROID) 88 MCG tablet Take 125 mcg by mouth daily before breakfast.     . Multiple Vitamin (MULITIVITAMIN WITH MINERALS) TABS Take 1 tablet by mouth daily.      Social History   Socioeconomic History  . Marital status: Married    Spouse name: Not on file  . Number of children: Not on file  . Years of  education: Not on file  . Highest education level: Not on file  Occupational History  . Not on file  Social Needs  . Financial resource strain: Not on file  . Food insecurity:    Worry: Not on file    Inability: Not on file  . Transportation needs:    Medical: Not on file    Non-medical: Not on file  Tobacco Use  . Smoking status: Never Smoker  . Smokeless tobacco: Never Used  Substance and Sexual Activity  . Alcohol use: No  . Drug use: No  . Sexual activity: Yes    Birth control/protection: IUD, Surgical    Comment: IUD prior to pregnancy  Lifestyle  . Physical activity:    Days per week: Not on file    Minutes per session: Not on file  . Stress: Not on file  Relationships  . Social connections:    Talks on phone: Not on file    Gets together: Not on file    Attends religious service: Not on file    Active member of club or organization: Not on file    Attends meetings of clubs or organizations: Not on file    Relationship status: Not on file  . Intimate partner violence:    Fear of current or ex partner: Not on file    Emotionally abused: Not on file  Physically abused: Not on file    Forced sexual activity: Not on file  Other Topics Concern  . Not on file  Social History Narrative  . Not on file   Family History  Problem Relation Age of Onset  . Arthritis Father      OBJECTIVE:  Vitals:   11/19/17 0814 11/19/17 0816  BP: 126/79   Pulse: 99   Resp: 18   Temp: 98.7 F (37.1 C)   TempSrc: Oral   SpO2: 96%   Weight:  178 lb (80.7 kg)     General appearance: Alert, appears fatigued, but nontoxic; speaking in full sentences without difficulty HEENT:NCAT; Ears: EACs clear, TMs pearly gray; Eyes: PERRL.  EOM grossly intact. Sinuses: Maxillary and frontal sinus tenderness. Nose: nares patent without rhinorrhea; Throat: tonsils nonerythematous or enlarged, uvula midline  Neck: supple without LAD Lungs: clear to auscultation bilaterally without  adventitious breath sounds; normal respiratory effort; no cough present Heart: regular rate and rhythm.  Radial pulses 2+ symmetrical bilaterally Skin: warm and dry Psychological: alert and cooperative; normal mood and affect  ASSESSMENT & PLAN:  1. Acute non-recurrent maxillary sinusitis   2. Cough     Meds ordered this encounter  Medications  . doxycycline (VIBRAMYCIN) 100 MG capsule    Sig: Take 1 capsule (100 mg total) by mouth 2 (two) times daily.    Dispense:  20 capsule    Refill:  0    Order Specific Question:   Supervising Provider    Answer:   Isa RankinMURRAY, LAURA WILSON [id number][[id number]]  . benzonatate (TESSALON) 100 MG capsule    Sig: Take 1 capsule (100 mg total) by mouth every 8 (eight) hours.    Dispense:  21 capsule    Refill:  0    Order Specific Question:   Supervising Provider    Answer:   Isa RankinMURRAY, LAURA WILSON [[id number]]   Rest and push fluids Doxycycline prescribed.  Take as directed and to completion\ Tessalon perles prescribed.  Use as needed for cough Continue with OTC ibuprofen/tylenol as needed for pain Follow up with PCP or [facility] if symptoms persists Return or go to the ED if you have any new or worsening symptoms such as fever, chills, worsening sinus pain/pressure, cough, sore throat, chest pain, shortness of breath, abdominal pain, changes in bowel or bladder habits, etc...  Reviewed expectations re: course of current medical issues. Questions answered. Outlined signs and symptoms indicating need for more acute intervention. Patient verbalized understanding. After Visit Summary given.          Rennis HardingWurst, Noretta Frier, PA-C 11/19/17 1011

## 2017-11-19 NOTE — ED Triage Notes (Signed)
Pt cc cough and headache x 6 days.

## 2019-02-10 MED FILL — buPROPion HCL ER (XL) 150 M: 150 | 30 days supply | Qty: 30 | Fill #0

## 2019-02-10 MED FILL — LEVOTHYROXINE 137 MCG TAB: 137 | 90 days supply | Qty: 90 | Fill #0

## 2019-02-17 MED FILL — ADDERALL XR 20 MG CAP SA: 20 | 30 days supply | Qty: 30 | Fill #0

## 2019-02-27 DIAGNOSIS — N201 Calculus of ureter: Secondary | ICD-10-CM | POA: Diagnosis not present

## 2019-02-27 DIAGNOSIS — R8271 Bacteriuria: Secondary | ICD-10-CM | POA: Diagnosis not present

## 2019-02-27 MED FILL — TAMSULOSIN HCL 0.4 MG CAP: 0.4 | 21 days supply | Qty: 21 | Fill #0

## 2019-02-27 MED FILL — NITROFURANTOIN MONO-MCR 100: 100 | 7 days supply | Qty: 14 | Fill #0

## 2019-03-05 DIAGNOSIS — F419 Anxiety disorder, unspecified: Secondary | ICD-10-CM | POA: Diagnosis not present

## 2019-03-05 DIAGNOSIS — F902 Attention-deficit hyperactivity disorder, combined type: Secondary | ICD-10-CM | POA: Diagnosis not present

## 2019-03-05 DIAGNOSIS — Z79899 Other long term (current) drug therapy: Secondary | ICD-10-CM | POA: Diagnosis not present

## 2019-03-05 MED FILL — VYVANSE 40 MG CAPSULE: 40 | 30 days supply | Qty: 30 | Fill #0

## 2019-03-16 DIAGNOSIS — E039 Hypothyroidism, unspecified: Secondary | ICD-10-CM | POA: Diagnosis not present

## 2019-03-16 MED FILL — buPROPion HCL ER (XL) 150 M: 150 | 30 days supply | Qty: 30 | Fill #1

## 2019-03-19 DIAGNOSIS — E039 Hypothyroidism, unspecified: Secondary | ICD-10-CM | POA: Diagnosis not present

## 2019-03-19 MED FILL — ARMOUR THYROID 90 MG TABLET: 90 | 30 days supply | Qty: 30 | Fill #0

## 2019-04-07 MED FILL — VYVANSE 40 MG CAPSULE: 40 | 30 days supply | Qty: 30 | Fill #0

## 2019-04-16 MED FILL — ARMOUR THYROID 90 MG TABLET: 90 | 30 days supply | Qty: 30 | Fill #1

## 2019-04-16 MED FILL — buPROPion HCL ER (XL) 150 M: 150 | 30 days supply | Qty: 30 | Fill #2

## 2019-05-18 MED FILL — ARMOUR THYROID 90 MG TABLET: 90 | 30 days supply | Qty: 30 | Fill #2

## 2019-05-19 DIAGNOSIS — E039 Hypothyroidism, unspecified: Secondary | ICD-10-CM | POA: Diagnosis not present

## 2019-05-19 MED FILL — buPROPion HCL ER (XL) 150 M: 150 | 30 days supply | Qty: 30 | Fill #3

## 2019-05-20 ENCOUNTER — Other Ambulatory Visit (HOSPITAL_COMMUNITY): Payer: Self-pay | Admitting: Endocrinology

## 2019-05-20 MED FILL — ARMOUR THYROID 120 MG TAB: 120 | 30 days supply | Qty: 30 | Fill #0

## 2019-06-19 ENCOUNTER — Other Ambulatory Visit (HOSPITAL_COMMUNITY): Payer: Self-pay | Admitting: Physician Assistant

## 2019-06-19 MED FILL — buPROPion HCL ER (XL) 150 M: 150 | 30 days supply | Qty: 30 | Fill #0

## 2019-06-26 DIAGNOSIS — F902 Attention-deficit hyperactivity disorder, combined type: Secondary | ICD-10-CM | POA: Diagnosis not present

## 2019-06-26 DIAGNOSIS — Z79899 Other long term (current) drug therapy: Secondary | ICD-10-CM | POA: Diagnosis not present

## 2019-06-26 DIAGNOSIS — F419 Anxiety disorder, unspecified: Secondary | ICD-10-CM | POA: Diagnosis not present

## 2019-06-26 MED FILL — VYVANSE 40 MG CAPSULE: 40 | 30 days supply | Qty: 30 | Fill #0

## 2019-07-10 MED FILL — ARMOUR THYROID 120 MG TAB: 120 | 30 days supply | Qty: 30 | Fill #1

## 2019-07-24 MED FILL — buPROPion HCL ER (XL) 150 M: 150 | 30 days supply | Qty: 30 | Fill #1

## 2019-08-03 MED FILL — VYVANSE 40 MG CAPSULE: 40 | 30 days supply | Qty: 30 | Fill #0

## 2019-08-14 MED FILL — ARMOUR THYROID 120 MG TAB: 120 | 30 days supply | Qty: 30 | Fill #2

## 2019-08-24 MED FILL — buPROPion HCL ER (XL) 150 M: 150 | 30 days supply | Qty: 30 | Fill #2

## 2019-09-03 MED FILL — VYVANSE 40 MG CAPSULE: 40 | 30 days supply | Qty: 30 | Fill #0

## 2019-09-14 MED FILL — ARMOUR THYROID 120 MG TAB: 120 | 30 days supply | Qty: 30 | Fill #3

## 2019-09-22 DIAGNOSIS — F419 Anxiety disorder, unspecified: Secondary | ICD-10-CM | POA: Diagnosis not present

## 2019-09-22 DIAGNOSIS — Z79899 Other long term (current) drug therapy: Secondary | ICD-10-CM | POA: Diagnosis not present

## 2019-09-22 DIAGNOSIS — F902 Attention-deficit hyperactivity disorder, combined type: Secondary | ICD-10-CM | POA: Diagnosis not present

## 2019-09-23 MED FILL — buPROPion HCL ER (XL) 150 M: 150 | 30 days supply | Qty: 30 | Fill #3

## 2019-10-05 MED FILL — VYVANSE 40 MG CAPSULE: 40 | 30 days supply | Qty: 30 | Fill #0

## 2019-10-23 MED FILL — ARMOUR THYROID 120 MG TAB: 120 | 30 days supply | Qty: 30 | Fill #4

## 2019-10-23 MED FILL — buPROPion HCL ER (XL) 150 M: 150 | 30 days supply | Qty: 30 | Fill #4

## 2019-11-06 MED FILL — VYVANSE 40 MG CAPSULE: 40 | 30 days supply | Qty: 30 | Fill #0

## 2019-11-23 MED FILL — buPROPion HCL ER (XL) 150 M: 150 | 30 days supply | Qty: 30 | Fill #5

## 2019-11-23 MED FILL — ARMOUR THYROID 120 MG TAB: 120 | 30 days supply | Qty: 30 | Fill #5

## 2019-12-11 MED FILL — VYVANSE 40 MG CAPSULE: 40 | 30 days supply | Qty: 30 | Fill #0

## 2019-12-29 ENCOUNTER — Other Ambulatory Visit (HOSPITAL_COMMUNITY): Payer: Self-pay | Admitting: Physician Assistant

## 2019-12-29 MED FILL — ARMOUR THYROID 120 MG TAB: 120 | 30 days supply | Qty: 30 | Fill #6

## 2019-12-29 MED FILL — buPROPion HCL ER (XL) 150 M: 150 | 30 days supply | Qty: 30 | Fill #0

## 2020-01-13 DIAGNOSIS — K5900 Constipation, unspecified: Secondary | ICD-10-CM | POA: Diagnosis not present

## 2020-01-13 DIAGNOSIS — Z800 Family history of malignant neoplasm of digestive organs: Secondary | ICD-10-CM | POA: Diagnosis not present

## 2020-01-13 DIAGNOSIS — K625 Hemorrhage of anus and rectum: Secondary | ICD-10-CM | POA: Diagnosis not present

## 2020-01-28 ENCOUNTER — Other Ambulatory Visit (HOSPITAL_COMMUNITY): Payer: Self-pay

## 2020-01-28 ENCOUNTER — Telehealth: Payer: 59 | Admitting: Family

## 2020-01-28 DIAGNOSIS — U071 COVID-19: Secondary | ICD-10-CM | POA: Diagnosis not present

## 2020-01-28 DIAGNOSIS — B9789 Other viral agents as the cause of diseases classified elsewhere: Secondary | ICD-10-CM

## 2020-01-28 DIAGNOSIS — J029 Acute pharyngitis, unspecified: Secondary | ICD-10-CM | POA: Diagnosis not present

## 2020-01-28 DIAGNOSIS — Z20822 Contact with and (suspected) exposure to covid-19: Secondary | ICD-10-CM | POA: Diagnosis not present

## 2020-01-28 DIAGNOSIS — J028 Acute pharyngitis due to other specified organisms: Secondary | ICD-10-CM

## 2020-01-28 DIAGNOSIS — Z03818 Encounter for observation for suspected exposure to other biological agents ruled out: Secondary | ICD-10-CM | POA: Diagnosis not present

## 2020-01-28 MED FILL — LIDOCAINE 2% VISCOUS SOLN: 2 | 2 days supply | Qty: 100 | Fill #0

## 2020-01-28 MED FILL — methylPREDNISolone 4 MG dos: 4 | 6 days supply | Qty: 21 | Fill #0

## 2020-01-28 NOTE — Progress Notes (Signed)
We are sorry that you are not feeling well.  Here is how we plan to help!  Your symptoms indicate a likely viral infection (Pharyngitis).   Pharyngitis is inflammation in the back of the throat which can cause a sore throat, scratchiness and sometimes difficulty swallowing.   Pharyngitis is typically caused by a respiratory virus and will just run its course.  Please keep in mind that your symptoms could last up to 10 days.  For throat pain, we recommend over the counter oral pain relief medications such as acetaminophen or aspirin, or anti-inflammatory medications such as ibuprofen or naproxen sodium.  Topical treatments such as oral throat lozenges or sprays may be used as needed.  Avoid close contact with loved ones, especially the very young and elderly.  Remember to wash your hands thoroughly throughout the day as this is the number one way to prevent the spread of infection and wipe down door knobs and counters with disinfectant.  Sore throat has been a very common complaint and side effect of this particular strain of COVID 19.  After careful review of your answers, I would not recommend and antibiotic for your condition.  Antibiotics should not be used to treat conditions that we suspect are caused by viruses like the virus that causes the common cold or flu. However, some people can have Strep with atypical symptoms. You may need formal testing in clinic or office to confirm if your symptoms continue or worsen.  Providers prescribe antibiotics to treat infections caused by bacteria. Antibiotics are very powerful in treating bacterial infections when they are used properly.  To maintain their effectiveness, they should be used only when necessary.  Overuse of antibiotics has resulted in the development of super bugs that are resistant to treatment!    Home Care:  Only take medications as instructed by your medical team.  Do not drink alcohol while taking these medications.  A steam or  ultrasonic humidifier can help congestion.  You can place a towel over your head and breathe in the steam from hot water coming from a faucet.  Avoid close contacts especially the very young and the elderly.  Cover your mouth when you cough or sneeze.  Always remember to wash your hands.  Get Help Right Away If:  You develop worsening fever or throat pain.  You develop a severe head ache or visual changes.  Your symptoms persist after you have completed your treatment plan.  Make sure you  Understand these instructions.  Will watch your condition.  Will get help right away if you are not doing well or get worse.  Your e-visit answers were reviewed by a board certified advanced clinical practitioner to complete your personal care plan.  Depending on the condition, your plan could have included both over the counter or prescription medications.  If there is a problem please reply  once you have received a response from your provider.  Your safety is important to [place].  If you have drug allergies check your prescription carefully.    You can use MyChart to ask questions about todays visit, request a non-urgent call back, or ask for a work or school excuse for 24 hours related to this e-Visit. If it has been greater than 24 hours you will need to follow up with your provider, or enter a new e-Visit to address those concerns.  You will get an e-mail in the next two days asking about your experience.  I hope that your e-visit  has been valuable and will speed your recovery. Thank you for using e-visits.  Greater than 5 minutes, yet less than 10 minutes of time have been spent researching, coordinating, and implementing care for this patient.

## 2020-02-04 ENCOUNTER — Other Ambulatory Visit (HOSPITAL_COMMUNITY): Payer: Self-pay | Admitting: Endocrinology

## 2020-02-04 MED FILL — ARMOUR THYROID 120 MG TAB: 120 | 30 days supply | Qty: 30 | Fill #0

## 2020-02-04 MED FILL — buPROPion HCL ER (XL) 150 M: 150 | 30 days supply | Qty: 30 | Fill #1

## 2020-02-08 ENCOUNTER — Other Ambulatory Visit (HOSPITAL_COMMUNITY): Payer: Self-pay | Admitting: Internal Medicine

## 2020-02-08 DIAGNOSIS — Z79899 Other long term (current) drug therapy: Secondary | ICD-10-CM | POA: Diagnosis not present

## 2020-02-08 DIAGNOSIS — F902 Attention-deficit hyperactivity disorder, combined type: Secondary | ICD-10-CM | POA: Diagnosis not present

## 2020-02-08 DIAGNOSIS — F419 Anxiety disorder, unspecified: Secondary | ICD-10-CM | POA: Diagnosis not present

## 2020-02-08 MED FILL — VYVANSE 40 MG CAPSULE: 40 | 30 days supply | Qty: 30 | Fill #0

## 2020-02-09 DIAGNOSIS — Z79899 Other long term (current) drug therapy: Secondary | ICD-10-CM | POA: Diagnosis not present

## 2020-02-09 DIAGNOSIS — F902 Attention-deficit hyperactivity disorder, combined type: Secondary | ICD-10-CM | POA: Diagnosis not present

## 2020-02-18 DIAGNOSIS — K625 Hemorrhage of anus and rectum: Secondary | ICD-10-CM | POA: Diagnosis not present

## 2020-02-18 DIAGNOSIS — K6389 Other specified diseases of intestine: Secondary | ICD-10-CM | POA: Diagnosis not present

## 2020-02-18 DIAGNOSIS — K602 Anal fissure, unspecified: Secondary | ICD-10-CM | POA: Diagnosis not present

## 2020-02-18 DIAGNOSIS — Z800 Family history of malignant neoplasm of digestive organs: Secondary | ICD-10-CM | POA: Diagnosis not present

## 2020-02-18 DIAGNOSIS — Z8371 Family history of colonic polyps: Secondary | ICD-10-CM | POA: Diagnosis not present

## 2020-03-07 ENCOUNTER — Ambulatory Visit (INDEPENDENT_AMBULATORY_CARE_PROVIDER_SITE_OTHER): Payer: Self-pay

## 2020-03-07 ENCOUNTER — Other Ambulatory Visit: Payer: Self-pay

## 2020-03-07 DIAGNOSIS — I781 Nevus, non-neoplastic: Secondary | ICD-10-CM

## 2020-03-07 NOTE — Progress Notes (Signed)
Treated pt's spider veins and reticular veins on bilateral legs with Asclera 1% administered with a 27 gauge needle. Easy access for most veins; pt tolerated well. Patient received a total of 4 mL (2 vials). Pt given post procedure care instructions both on handout and verbally. She will follow up PRN.   Photos: Yes.    Compression stockings applied: Yes.

## 2020-03-09 ENCOUNTER — Other Ambulatory Visit (HOSPITAL_COMMUNITY): Payer: Self-pay | Admitting: Physician Assistant

## 2020-03-09 MED FILL — buPROPion HCL ER (XL) 150 M: 150 | 30 days supply | Qty: 30 | Fill #0

## 2020-03-09 MED FILL — ARMOUR THYROID 120 MG TAB: 120 | 30 days supply | Qty: 30 | Fill #1

## 2020-03-17 DIAGNOSIS — E039 Hypothyroidism, unspecified: Secondary | ICD-10-CM | POA: Diagnosis not present

## 2020-03-23 ENCOUNTER — Other Ambulatory Visit (HOSPITAL_COMMUNITY): Payer: Self-pay | Admitting: Internal Medicine

## 2020-03-23 ENCOUNTER — Ambulatory Visit: Payer: Self-pay | Attending: Internal Medicine

## 2020-03-23 DIAGNOSIS — Z23 Encounter for immunization: Secondary | ICD-10-CM

## 2020-03-23 MED FILL — VYVANSE 40 MG CAPSULE: 40 | 30 days supply | Qty: 30 | Fill #0

## 2020-03-23 NOTE — Progress Notes (Signed)
   Covid-19 Vaccination Clinic  Name:  [name]    MRN: [id number] DOB: September 24, 1975  03/23/2020  [name] was observed post Covid-19 immunization for 15 minutes without incident. She was provided with Vaccine Information Sheet and instruction to access the V-Safe system.   [name] was instructed to call 911 with any severe reactions post vaccine: Marland Kitchen Difficulty breathing  . Swelling of face and throat  . A fast heartbeat  . A bad rash all over body  . Dizziness and weakness   Immunizations Administered    Name Date Dose VIS Date Route   PFIZER Comrnaty(Gray TOP) Covid-19 Vaccine 03/23/2020 11:30 AM 0.3 mL 12/10/2019 Intramuscular   Manufacturer: [employer], [employer]   Lot: [id number]   NDC: [phone]

## 2020-03-24 ENCOUNTER — Other Ambulatory Visit (HOSPITAL_COMMUNITY): Payer: Self-pay | Admitting: Endocrinology

## 2020-03-24 DIAGNOSIS — E039 Hypothyroidism, unspecified: Secondary | ICD-10-CM | POA: Diagnosis not present

## 2020-03-24 DIAGNOSIS — R000 Tachycardia, unspecified: Secondary | ICD-10-CM | POA: Diagnosis not present

## 2020-03-24 MED FILL — METOPROLOL SUCCINATE ER 25: 25 | 30 days supply | Qty: 30 | Fill #0

## 2020-03-24 MED FILL — LEVOTHYROXINE SODIUM 150 MC: 150 | 30 days supply | Qty: 30 | Fill #0

## 2020-04-11 ENCOUNTER — Other Ambulatory Visit (HOSPITAL_COMMUNITY): Payer: Self-pay

## 2020-04-11 MED ORDER — BUPROPION HCL ER (XL) 150 MG PO TB24
150.0000 mg | ORAL_TABLET | Freq: Every day | ORAL | 11 refills | Status: DC
  Filled 2020-04-11: qty 60, 30d supply, fill #0
  Filled 2020-06-13: qty 60, 30d supply, fill #1

## 2020-04-15 ENCOUNTER — Other Ambulatory Visit (HOSPITAL_COMMUNITY): Payer: Self-pay

## 2020-04-27 ENCOUNTER — Other Ambulatory Visit (HOSPITAL_COMMUNITY): Payer: Self-pay

## 2020-04-27 DIAGNOSIS — Z1231 Encounter for screening mammogram for malignant neoplasm of breast: Secondary | ICD-10-CM | POA: Diagnosis not present

## 2020-04-27 DIAGNOSIS — Z6831 Body mass index (BMI) 31.0-31.9, adult: Secondary | ICD-10-CM | POA: Diagnosis not present

## 2020-04-27 DIAGNOSIS — Z01419 Encounter for gynecological examination (general) (routine) without abnormal findings: Secondary | ICD-10-CM | POA: Diagnosis not present

## 2020-04-27 MED ORDER — IBUPROFEN 800 MG PO TABS
800.0000 mg | ORAL_TABLET | Freq: Four times a day (QID) | ORAL | 0 refills | Status: DC
  Filled 2020-04-27: qty 21, 5d supply, fill #0

## 2020-04-27 MED ORDER — AMOXICILLIN 500 MG PO CAPS
500.0000 mg | ORAL_CAPSULE | Freq: Three times a day (TID) | ORAL | 0 refills | Status: DC
  Filled 2020-04-27: qty 21, 7d supply, fill #0

## 2020-04-27 MED FILL — Levothyroxine Sodium Tab 150 MCG: ORAL | 30 days supply | Qty: 30 | Fill #0 | Status: AC

## 2020-04-27 MED FILL — Lisdexamfetamine Dimesylate Cap 40 MG: ORAL | 30 days supply | Qty: 30 | Fill #0 | Status: AC

## 2020-04-29 ENCOUNTER — Other Ambulatory Visit (HOSPITAL_COMMUNITY): Payer: Self-pay

## 2020-05-02 DIAGNOSIS — E039 Hypothyroidism, unspecified: Secondary | ICD-10-CM | POA: Diagnosis not present

## 2020-05-03 ENCOUNTER — Other Ambulatory Visit (HOSPITAL_COMMUNITY): Payer: Self-pay

## 2020-05-03 MED ORDER — LEVOTHYROXINE SODIUM 137 MCG PO TABS
137.0000 ug | ORAL_TABLET | Freq: Every day | ORAL | 6 refills | Status: DC
  Filled 2020-05-03: qty 30, 30d supply, fill #0

## 2020-05-10 DIAGNOSIS — F419 Anxiety disorder, unspecified: Secondary | ICD-10-CM | POA: Diagnosis not present

## 2020-05-10 DIAGNOSIS — F902 Attention-deficit hyperactivity disorder, combined type: Secondary | ICD-10-CM | POA: Diagnosis not present

## 2020-05-10 DIAGNOSIS — Z79899 Other long term (current) drug therapy: Secondary | ICD-10-CM | POA: Diagnosis not present

## 2020-05-11 ENCOUNTER — Other Ambulatory Visit (HOSPITAL_COMMUNITY): Payer: Self-pay

## 2020-05-20 ENCOUNTER — Other Ambulatory Visit (HOSPITAL_COMMUNITY): Payer: Self-pay

## 2020-05-20 MED ORDER — VYVANSE 40 MG PO CAPS: 40.0000 mg | ORAL_CAPSULE | Freq: Every day | ORAL | 0 refills | Status: DC

## 2020-05-20 MED ORDER — VYVANSE 40 MG PO CAPS
40.0000 mg | ORAL_CAPSULE | Freq: Every day | ORAL | 0 refills | Status: DC
  Filled 2020-05-20 – 2020-05-31 (×2): qty 30, 30d supply, fill #0

## 2020-05-31 ENCOUNTER — Other Ambulatory Visit (HOSPITAL_COMMUNITY): Payer: Self-pay

## 2020-06-13 ENCOUNTER — Other Ambulatory Visit (HOSPITAL_COMMUNITY): Payer: Self-pay

## 2020-09-08 DIAGNOSIS — L820 Inflamed seborrheic keratosis: Secondary | ICD-10-CM | POA: Diagnosis not present

## 2020-09-08 DIAGNOSIS — Z789 Other specified health status: Secondary | ICD-10-CM | POA: Diagnosis not present

## 2020-09-08 DIAGNOSIS — R208 Other disturbances of skin sensation: Secondary | ICD-10-CM | POA: Diagnosis not present

## 2020-09-08 DIAGNOSIS — L538 Other specified erythematous conditions: Secondary | ICD-10-CM | POA: Diagnosis not present

## 2020-11-11 DIAGNOSIS — F419 Anxiety disorder, unspecified: Secondary | ICD-10-CM | POA: Diagnosis not present

## 2020-11-11 DIAGNOSIS — F902 Attention-deficit hyperactivity disorder, combined type: Secondary | ICD-10-CM | POA: Diagnosis not present

## 2020-11-11 DIAGNOSIS — Z79899 Other long term (current) drug therapy: Secondary | ICD-10-CM | POA: Diagnosis not present

## 2020-12-26 DIAGNOSIS — M25561 Pain in right knee: Secondary | ICD-10-CM | POA: Diagnosis not present

## 2020-12-26 DIAGNOSIS — S86911A Strain of unspecified muscle(s) and tendon(s) at lower leg level, right leg, initial encounter: Secondary | ICD-10-CM | POA: Diagnosis not present

## 2021-01-05 DIAGNOSIS — M25561 Pain in right knee: Secondary | ICD-10-CM | POA: Diagnosis not present

## 2021-02-13 DIAGNOSIS — F902 Attention-deficit hyperactivity disorder, combined type: Secondary | ICD-10-CM | POA: Diagnosis not present

## 2021-02-13 DIAGNOSIS — Z79899 Other long term (current) drug therapy: Secondary | ICD-10-CM | POA: Diagnosis not present

## 2021-02-15 DIAGNOSIS — F902 Attention-deficit hyperactivity disorder, combined type: Secondary | ICD-10-CM | POA: Diagnosis not present

## 2021-03-24 DIAGNOSIS — E669 Obesity, unspecified: Secondary | ICD-10-CM | POA: Diagnosis not present

## 2021-03-24 DIAGNOSIS — E785 Hyperlipidemia, unspecified: Secondary | ICD-10-CM | POA: Diagnosis not present

## 2021-03-24 DIAGNOSIS — E039 Hypothyroidism, unspecified: Secondary | ICD-10-CM | POA: Diagnosis not present

## 2021-03-27 ENCOUNTER — Other Ambulatory Visit (HOSPITAL_COMMUNITY): Payer: Self-pay

## 2021-03-30 ENCOUNTER — Ambulatory Visit: Payer: BC Managed Care – PPO | Admitting: Family

## 2021-03-31 ENCOUNTER — Ambulatory Visit: Payer: Self-pay | Admitting: Family

## 2021-05-09 DIAGNOSIS — F902 Attention-deficit hyperactivity disorder, combined type: Secondary | ICD-10-CM | POA: Diagnosis not present

## 2021-05-09 DIAGNOSIS — Z79899 Other long term (current) drug therapy: Secondary | ICD-10-CM | POA: Diagnosis not present

## 2021-06-19 ENCOUNTER — Other Ambulatory Visit (HOSPITAL_COMMUNITY): Payer: Self-pay

## 2021-06-19 MED ORDER — WEGOVY 1.7 MG/0.75ML ~~LOC~~ SOAJ
1.7000 mg | SUBCUTANEOUS | 4 refills | Status: DC
  Filled 2021-06-19: qty 3, 28d supply, fill #0
  Filled 2021-07-12: qty 3, 28d supply, fill #1
  Filled 2021-08-08 – 2021-08-14 (×4): qty 3, 28d supply, fill #2

## 2021-07-12 ENCOUNTER — Other Ambulatory Visit (HOSPITAL_COMMUNITY): Payer: Self-pay

## 2021-07-24 DIAGNOSIS — E785 Hyperlipidemia, unspecified: Secondary | ICD-10-CM | POA: Diagnosis not present

## 2021-07-24 DIAGNOSIS — E039 Hypothyroidism, unspecified: Secondary | ICD-10-CM | POA: Diagnosis not present

## 2021-07-24 DIAGNOSIS — E669 Obesity, unspecified: Secondary | ICD-10-CM | POA: Diagnosis not present

## 2021-08-09 ENCOUNTER — Other Ambulatory Visit (HOSPITAL_COMMUNITY): Payer: Self-pay

## 2021-08-11 ENCOUNTER — Other Ambulatory Visit (HOSPITAL_COMMUNITY): Payer: Self-pay

## 2021-08-14 ENCOUNTER — Other Ambulatory Visit (HOSPITAL_COMMUNITY): Payer: Self-pay

## 2021-08-15 ENCOUNTER — Other Ambulatory Visit (HOSPITAL_COMMUNITY): Payer: Self-pay

## 2021-08-15 MED ORDER — WEGOVY 2.4 MG/0.75ML ~~LOC~~ SOAJ
0.7500 mL | SUBCUTANEOUS | 6 refills | Status: DC
  Filled 2021-08-15: qty 3, 28d supply, fill #0
  Filled 2021-09-07: qty 3, 28d supply, fill #1
  Filled 2021-10-05: qty 3, 28d supply, fill #2
  Filled 2021-11-02: qty 3, 28d supply, fill #3
  Filled 2021-11-29: qty 3, 28d supply, fill #4
  Filled 2021-12-29: qty 3, 28d supply, fill #5
  Filled 2022-01-25: qty 3, 28d supply, fill #6

## 2021-09-07 ENCOUNTER — Other Ambulatory Visit (HOSPITAL_COMMUNITY): Payer: Self-pay

## 2021-10-05 ENCOUNTER — Other Ambulatory Visit (HOSPITAL_COMMUNITY): Payer: Self-pay

## 2021-11-02 ENCOUNTER — Other Ambulatory Visit (HOSPITAL_COMMUNITY): Payer: Self-pay

## 2021-11-15 DIAGNOSIS — F902 Attention-deficit hyperactivity disorder, combined type: Secondary | ICD-10-CM | POA: Diagnosis not present

## 2021-11-15 DIAGNOSIS — Z79899 Other long term (current) drug therapy: Secondary | ICD-10-CM | POA: Diagnosis not present

## 2021-11-21 DIAGNOSIS — F902 Attention-deficit hyperactivity disorder, combined type: Secondary | ICD-10-CM | POA: Diagnosis not present

## 2021-11-27 DIAGNOSIS — R81 Glycosuria: Secondary | ICD-10-CM | POA: Diagnosis not present

## 2021-11-27 DIAGNOSIS — E669 Obesity, unspecified: Secondary | ICD-10-CM | POA: Diagnosis not present

## 2021-11-27 DIAGNOSIS — E039 Hypothyroidism, unspecified: Secondary | ICD-10-CM | POA: Diagnosis not present

## 2021-11-29 ENCOUNTER — Other Ambulatory Visit (HOSPITAL_COMMUNITY): Payer: Self-pay

## 2021-12-04 ENCOUNTER — Other Ambulatory Visit (HOSPITAL_COMMUNITY): Payer: Self-pay

## 2021-12-04 DIAGNOSIS — E785 Hyperlipidemia, unspecified: Secondary | ICD-10-CM | POA: Diagnosis not present

## 2021-12-04 DIAGNOSIS — E039 Hypothyroidism, unspecified: Secondary | ICD-10-CM | POA: Diagnosis not present

## 2021-12-04 DIAGNOSIS — E669 Obesity, unspecified: Secondary | ICD-10-CM | POA: Diagnosis not present

## 2021-12-04 DIAGNOSIS — R81 Glycosuria: Secondary | ICD-10-CM | POA: Diagnosis not present

## 2021-12-04 MED ORDER — LEVOTHYROXINE SODIUM 125 MCG PO TABS
125.0000 ug | ORAL_TABLET | Freq: Every morning | ORAL | 5 refills | Status: DC
  Filled 2021-12-04: qty 30, 30d supply, fill #0
  Filled 2021-12-29 (×2): qty 30, 30d supply, fill #1
  Filled 2022-01-29: qty 30, 30d supply, fill #2
  Filled 2022-03-01: qty 30, 30d supply, fill #3
  Filled 2022-04-02: qty 30, 30d supply, fill #4
  Filled 2022-04-30: qty 30, 30d supply, fill #5

## 2021-12-06 ENCOUNTER — Other Ambulatory Visit (HOSPITAL_COMMUNITY): Payer: Self-pay

## 2021-12-06 MED ORDER — LISDEXAMFETAMINE DIMESYLATE 50 MG PO CAPS
50.0000 mg | ORAL_CAPSULE | Freq: Every day | ORAL | 0 refills | Status: DC
  Filled 2021-12-06: qty 30, 30d supply, fill #0

## 2021-12-29 ENCOUNTER — Other Ambulatory Visit (HOSPITAL_COMMUNITY): Payer: Self-pay

## 2021-12-29 ENCOUNTER — Other Ambulatory Visit: Payer: Self-pay

## 2022-01-02 ENCOUNTER — Other Ambulatory Visit (HOSPITAL_COMMUNITY): Payer: Self-pay

## 2022-01-02 DIAGNOSIS — N6320 Unspecified lump in the left breast, unspecified quadrant: Secondary | ICD-10-CM | POA: Diagnosis not present

## 2022-01-03 ENCOUNTER — Other Ambulatory Visit: Payer: Self-pay | Admitting: Obstetrics and Gynecology

## 2022-01-03 DIAGNOSIS — N6320 Unspecified lump in the left breast, unspecified quadrant: Secondary | ICD-10-CM

## 2022-01-18 ENCOUNTER — Other Ambulatory Visit (HOSPITAL_COMMUNITY): Payer: Self-pay

## 2022-01-18 MED ORDER — LISDEXAMFETAMINE DIMESYLATE 50 MG PO CAPS
50.0000 mg | ORAL_CAPSULE | Freq: Every day | ORAL | 0 refills | Status: DC
  Filled 2022-01-18: qty 30, 30d supply, fill #0

## 2022-01-29 ENCOUNTER — Ambulatory Visit
Admission: RE | Admit: 2022-01-29 | Discharge: 2022-01-29 | Disposition: A | Discharge status: Home / Self Care | Payer: BC Managed Care – PPO | Source: Ambulatory Visit | Attending: Obstetrics and Gynecology | Admitting: Obstetrics and Gynecology

## 2022-01-29 ENCOUNTER — Other Ambulatory Visit (HOSPITAL_COMMUNITY): Payer: Self-pay

## 2022-01-29 DIAGNOSIS — N6320 Unspecified lump in the left breast, unspecified quadrant: Secondary | ICD-10-CM

## 2022-01-29 DIAGNOSIS — R928 Other abnormal and inconclusive findings on diagnostic imaging of breast: Secondary | ICD-10-CM | POA: Diagnosis not present

## 2022-01-29 DIAGNOSIS — N6489 Other specified disorders of breast: Secondary | ICD-10-CM | POA: Diagnosis not present

## 2022-02-06 DIAGNOSIS — E039 Hypothyroidism, unspecified: Secondary | ICD-10-CM | POA: Diagnosis not present

## 2022-02-22 ENCOUNTER — Other Ambulatory Visit (HOSPITAL_COMMUNITY): Payer: Self-pay

## 2022-02-23 ENCOUNTER — Other Ambulatory Visit (HOSPITAL_COMMUNITY): Payer: Self-pay

## 2022-02-23 MED ORDER — LISDEXAMFETAMINE DIMESYLATE 50 MG PO CAPS
50.0000 mg | ORAL_CAPSULE | Freq: Every day | ORAL | 0 refills | Status: DC
  Filled 2022-02-23: qty 30, 30d supply, fill #0

## 2022-02-27 ENCOUNTER — Other Ambulatory Visit (HOSPITAL_COMMUNITY): Payer: Self-pay

## 2022-02-28 ENCOUNTER — Other Ambulatory Visit (HOSPITAL_COMMUNITY): Payer: Self-pay

## 2022-02-28 MED ORDER — LISDEXAMFETAMINE DIMESYLATE 50 MG PO CAPS
50.0000 mg | ORAL_CAPSULE | Freq: Every day | ORAL | 0 refills | Status: DC
  Filled 2022-02-28 – 2022-03-01 (×2): qty 30, 30d supply, fill #0

## 2022-02-28 MED ORDER — WEGOVY 2.4 MG/0.75ML ~~LOC~~ SOAJ
2.4000 mg | SUBCUTANEOUS | 5 refills | Status: DC
  Filled 2022-02-28: qty 3, 28d supply, fill #0
  Filled 2022-04-02: qty 3, 28d supply, fill #1
  Filled 2022-04-30: qty 3, 28d supply, fill #2
  Filled 2022-06-04: qty 3, 28d supply, fill #3
  Filled 2022-07-03: qty 3, 28d supply, fill #4
  Filled 2022-08-05: qty 3, 28d supply, fill #5

## 2022-03-01 ENCOUNTER — Other Ambulatory Visit (HOSPITAL_COMMUNITY): Payer: Self-pay

## 2022-03-02 ENCOUNTER — Other Ambulatory Visit (HOSPITAL_COMMUNITY): Payer: Self-pay

## 2022-03-02 ENCOUNTER — Other Ambulatory Visit: Payer: Self-pay

## 2022-03-06 DIAGNOSIS — Z124 Encounter for screening for malignant neoplasm of cervix: Secondary | ICD-10-CM | POA: Diagnosis not present

## 2022-03-06 DIAGNOSIS — Z01419 Encounter for gynecological examination (general) (routine) without abnormal findings: Secondary | ICD-10-CM | POA: Diagnosis not present

## 2022-03-06 DIAGNOSIS — Z6825 Body mass index (BMI) 25.0-25.9, adult: Secondary | ICD-10-CM | POA: Diagnosis not present

## 2022-03-07 ENCOUNTER — Other Ambulatory Visit (HOSPITAL_COMMUNITY): Payer: Self-pay

## 2022-03-13 ENCOUNTER — Other Ambulatory Visit (HOSPITAL_COMMUNITY): Payer: Self-pay

## 2022-05-01 ENCOUNTER — Other Ambulatory Visit (HOSPITAL_COMMUNITY): Payer: Self-pay

## 2022-05-01 MED ORDER — LISDEXAMFETAMINE DIMESYLATE 50 MG PO CAPS
50.0000 mg | ORAL_CAPSULE | Freq: Every day | ORAL | 0 refills | Status: DC
  Filled 2022-05-01: qty 30, 30d supply, fill #0

## 2022-05-04 ENCOUNTER — Other Ambulatory Visit (HOSPITAL_COMMUNITY): Payer: Self-pay

## 2022-05-15 DIAGNOSIS — F902 Attention-deficit hyperactivity disorder, combined type: Secondary | ICD-10-CM | POA: Diagnosis not present

## 2022-05-15 DIAGNOSIS — Z79899 Other long term (current) drug therapy: Secondary | ICD-10-CM | POA: Diagnosis not present

## 2022-06-04 ENCOUNTER — Other Ambulatory Visit: Payer: Self-pay

## 2022-06-04 ENCOUNTER — Other Ambulatory Visit (HOSPITAL_COMMUNITY): Payer: Self-pay

## 2022-06-04 MED ORDER — LEVOTHYROXINE SODIUM 125 MCG PO TABS
125.0000 ug | ORAL_TABLET | Freq: Every morning | ORAL | 5 refills | Status: DC
  Filled 2022-06-04: qty 30, 30d supply, fill #0
  Filled 2022-07-03: qty 30, 30d supply, fill #1
  Filled 2022-08-05: qty 30, 30d supply, fill #2
  Filled 2022-09-02: qty 30, 30d supply, fill #3
  Filled 2022-10-10: qty 30, 30d supply, fill #4
  Filled 2022-11-06: qty 30, 30d supply, fill #5

## 2022-06-05 DIAGNOSIS — E039 Hypothyroidism, unspecified: Secondary | ICD-10-CM | POA: Diagnosis not present

## 2022-06-05 DIAGNOSIS — E669 Obesity, unspecified: Secondary | ICD-10-CM | POA: Diagnosis not present

## 2022-06-05 DIAGNOSIS — R81 Glycosuria: Secondary | ICD-10-CM | POA: Diagnosis not present

## 2022-06-05 DIAGNOSIS — E785 Hyperlipidemia, unspecified: Secondary | ICD-10-CM | POA: Diagnosis not present

## 2022-06-12 DIAGNOSIS — N926 Irregular menstruation, unspecified: Secondary | ICD-10-CM | POA: Diagnosis not present

## 2022-06-12 DIAGNOSIS — E039 Hypothyroidism, unspecified: Secondary | ICD-10-CM | POA: Diagnosis not present

## 2022-06-12 DIAGNOSIS — E785 Hyperlipidemia, unspecified: Secondary | ICD-10-CM | POA: Diagnosis not present

## 2022-06-12 DIAGNOSIS — E669 Obesity, unspecified: Secondary | ICD-10-CM | POA: Diagnosis not present

## 2022-06-13 ENCOUNTER — Other Ambulatory Visit: Payer: Self-pay | Admitting: Endocrinology

## 2022-06-13 DIAGNOSIS — N926 Irregular menstruation, unspecified: Secondary | ICD-10-CM

## 2022-06-15 ENCOUNTER — Ambulatory Visit
Admission: RE | Admit: 2022-06-15 | Discharge: 2022-06-15 | Disposition: A | Discharge status: Home / Self Care | Payer: BC Managed Care – PPO | Source: Ambulatory Visit | Attending: Endocrinology | Admitting: Endocrinology

## 2022-06-15 DIAGNOSIS — N926 Irregular menstruation, unspecified: Secondary | ICD-10-CM

## 2022-06-25 ENCOUNTER — Telehealth: Payer: BC Managed Care – PPO | Admitting: Physician Assistant

## 2022-06-25 ENCOUNTER — Other Ambulatory Visit (HOSPITAL_COMMUNITY): Payer: Self-pay

## 2022-06-25 DIAGNOSIS — R3989 Other symptoms and signs involving the genitourinary system: Secondary | ICD-10-CM

## 2022-06-25 MED ORDER — NITROFURANTOIN MONOHYD MACRO 100 MG PO CAPS
100.0000 mg | ORAL_CAPSULE | Freq: Two times a day (BID) | ORAL | 0 refills | Status: DC
Start: 2022-06-25 — End: 2022-08-08
  Filled 2022-06-25: qty 10, 5d supply, fill #0

## 2022-06-25 NOTE — Progress Notes (Signed)
E-Visit for Urinary Problems  We are sorry that you are not feeling well.  Here is how we plan to help!  Based on what you shared with me it looks like you most likely have a simple urinary tract infection.  A UTI (Urinary Tract Infection) is a bacterial infection of the bladder.  Most cases of urinary tract infections are simple to treat but a key part of your care is to encourage you to drink plenty of fluids and watch your symptoms carefully.  I have prescribed MacroBid 100 mg twice a day for 5 days.  Your symptoms should gradually improve. Call us if the burning in your urine worsens, you develop worsening fever, back pain or pelvic pain or if your symptoms do not resolve after completing the antibiotic.  Urinary tract infections can be prevented by drinking plenty of water to keep your body hydrated.  Also be sure when you wipe, wipe from front to back and don't hold it in!  If possible, empty your bladder every 4 hours.  HOME CARE Drink plenty of fluids Compete the full course of the antibiotics even if the symptoms resolve Remember, when you need to go.go. Holding in your urine can increase the likelihood of getting a UTI! GET HELP RIGHT AWAY IF: You cannot urinate You get a high fever Worsening back pain occurs You see blood in your urine You feel sick to your stomach or throw up You feel like you are going to pass out  MAKE SURE YOU  Understand these instructions. Will watch your condition. Will get help right away if you are not doing well or get worse.   Thank you for choosing an e-visit.  Your e-visit answers were reviewed by a board certified advanced clinical practitioner to complete your personal care plan. Depending upon the condition, your plan could have included both over the counter or prescription medications.  Please review your pharmacy choice. Make sure the pharmacy is open so you can pick up prescription now. If there is a problem, you may contact your  provider through MyChart messaging and have the prescription routed to another pharmacy.  Your safety is important to us. If you have drug allergies check your prescription carefully.   For the next 24 hours you can use MyChart to ask questions about today's visit, request a non-urgent call back, or ask for a work or school excuse. You will get an email in the next two days asking about your experience. I hope that your e-visit has been valuable and will speed your recovery.  I have spent 5 minutes in review of e-visit questionnaire, review and updating patient chart, medical decision making and response to patient.   Yoana Staib M Alahni Varone, PA-C  

## 2022-08-08 ENCOUNTER — Telehealth: Payer: BC Managed Care – PPO | Admitting: Nurse Practitioner

## 2022-08-08 ENCOUNTER — Other Ambulatory Visit (HOSPITAL_COMMUNITY): Payer: Self-pay

## 2022-08-08 DIAGNOSIS — R3989 Other symptoms and signs involving the genitourinary system: Secondary | ICD-10-CM | POA: Diagnosis not present

## 2022-08-08 MED ORDER — NITROFURANTOIN MONOHYD MACRO 100 MG PO CAPS
100.0000 mg | ORAL_CAPSULE | Freq: Two times a day (BID) | ORAL | 0 refills | Status: AC
Start: 2022-08-08 — End: 2022-08-13
  Filled 2022-08-08: qty 10, 5d supply, fill #0

## 2022-08-08 NOTE — Progress Notes (Signed)

## 2022-09-02 ENCOUNTER — Other Ambulatory Visit (HOSPITAL_COMMUNITY): Payer: Self-pay

## 2022-09-03 ENCOUNTER — Other Ambulatory Visit: Payer: Self-pay

## 2022-09-04 ENCOUNTER — Other Ambulatory Visit (HOSPITAL_COMMUNITY): Payer: Self-pay

## 2022-09-04 MED ORDER — WEGOVY 2.4 MG/0.75ML ~~LOC~~ SOAJ
2.4000 mg | SUBCUTANEOUS | 3 refills | Status: DC
  Filled 2022-09-04 – 2022-09-24 (×2): qty 3, 28d supply, fill #0
  Filled 2022-11-06: qty 3, 28d supply, fill #1
  Filled 2022-12-08: qty 3, 28d supply, fill #2
  Filled 2023-01-06 – 2023-01-18 (×2): qty 3, 28d supply, fill #3

## 2022-09-18 ENCOUNTER — Other Ambulatory Visit (HOSPITAL_COMMUNITY): Payer: Self-pay

## 2022-09-24 ENCOUNTER — Other Ambulatory Visit: Payer: Self-pay

## 2022-09-24 ENCOUNTER — Other Ambulatory Visit (HOSPITAL_COMMUNITY): Payer: Self-pay

## 2022-11-14 DIAGNOSIS — F902 Attention-deficit hyperactivity disorder, combined type: Secondary | ICD-10-CM | POA: Diagnosis not present

## 2022-11-14 DIAGNOSIS — Z79899 Other long term (current) drug therapy: Secondary | ICD-10-CM | POA: Diagnosis not present

## 2022-11-20 DIAGNOSIS — F902 Attention-deficit hyperactivity disorder, combined type: Secondary | ICD-10-CM | POA: Diagnosis not present

## 2022-11-27 DIAGNOSIS — E039 Hypothyroidism, unspecified: Secondary | ICD-10-CM | POA: Diagnosis not present

## 2022-11-27 DIAGNOSIS — E785 Hyperlipidemia, unspecified: Secondary | ICD-10-CM | POA: Diagnosis not present

## 2022-11-27 DIAGNOSIS — N926 Irregular menstruation, unspecified: Secondary | ICD-10-CM | POA: Diagnosis not present

## 2022-11-27 DIAGNOSIS — E669 Obesity, unspecified: Secondary | ICD-10-CM | POA: Diagnosis not present

## 2022-12-08 ENCOUNTER — Other Ambulatory Visit (HOSPITAL_COMMUNITY): Payer: Self-pay

## 2022-12-08 ENCOUNTER — Telehealth: Payer: BC Managed Care – PPO | Admitting: Nurse Practitioner

## 2022-12-08 DIAGNOSIS — R399 Unspecified symptoms and signs involving the genitourinary system: Secondary | ICD-10-CM | POA: Diagnosis not present

## 2022-12-08 MED ORDER — NITROFURANTOIN MONOHYD MACRO 100 MG PO CAPS
100.0000 mg | ORAL_CAPSULE | Freq: Two times a day (BID) | ORAL | 0 refills | Status: AC
Start: 2022-12-08 — End: 2022-12-13

## 2022-12-08 MED ORDER — LEVOTHYROXINE SODIUM 125 MCG PO TABS
125.0000 ug | ORAL_TABLET | Freq: Every morning | ORAL | 5 refills | Status: AC
  Filled 2022-12-08: qty 30, 30d supply, fill #0
  Filled 2023-01-06: qty 30, 30d supply, fill #1

## 2022-12-08 NOTE — Progress Notes (Signed)
I have spent 5 minutes in review of e-visit questionnaire, review and updating patient chart, medical decision making and response to patient.  ° °[name] W Secilia Apps, NP ° °  °

## 2022-12-08 NOTE — Progress Notes (Signed)
E-Visit for Urinary Problems ? ?We are sorry that you are not feeling well.  Here is how we plan to help! ? ?Based on what you shared with me it looks like you most likely have a simple urinary tract infection. ? ?A UTI (Urinary Tract Infection) is a bacterial infection of the bladder. ? ?Most cases of urinary tract infections are simple to treat but a key part of your care is to encourage you to drink plenty of fluids and watch your symptoms carefully. ? ?I have prescribed MacroBid 100 mg twice a day for 5 days.  Your symptoms should gradually improve. Call us if the burning in your urine worsens, you develop worsening fever, back pain or pelvic pain or if your symptoms do not resolve after completing the antibiotic. ? ?Urinary tract infections can be prevented by drinking plenty of water to keep your body hydrated.  Also be sure when you wipe, wipe from front to back and don't hold it in!  If possible, empty your bladder every 4 hours. ? ?HOME CARE ?Drink plenty of fluids ?Compete the full course of the antibiotics even if the symptoms resolve ?Remember, when you need to go?go. Holding in your urine can increase the likelihood of getting a UTI! ?GET HELP RIGHT AWAY IF: ?You cannot urinate ?You get a high fever ?Worsening back pain occurs ?You see blood in your urine ?You feel sick to your stomach or throw up ?You feel like you are going to pass out ? ?MAKE SURE YOU  ?Understand these instructions. ?Will watch your condition. ?Will get help right away if you are not doing well or get worse. ? ? ?Thank you for choosing an e-visit. ? ?Your e-visit answers were reviewed by a board certified advanced clinical practitioner to complete your personal care plan. Depending upon the condition, your plan could have included both over the counter or prescription medications. ? ?Please review your pharmacy choice. Make sure the pharmacy is open so you can pick up prescription now. If there is a problem, you may contact your  provider through MyChart messaging and have the prescription routed to another pharmacy.  Your safety is important to us. If you have drug allergies check your prescription carefully.  ? ?For the next 24 hours you can use MyChart to ask questions about today's visit, request a non-urgent call back, or ask for a work or school excuse. ?You will get an email in the next two days asking about your experience. I hope that your e-visit has been valuable and will speed your recovery.  ?

## 2022-12-10 ENCOUNTER — Other Ambulatory Visit: Payer: Self-pay

## 2023-01-07 ENCOUNTER — Other Ambulatory Visit (HOSPITAL_COMMUNITY): Payer: Self-pay

## 2023-01-14 ENCOUNTER — Other Ambulatory Visit (HOSPITAL_COMMUNITY): Payer: Self-pay

## 2023-01-15 ENCOUNTER — Other Ambulatory Visit (HOSPITAL_COMMUNITY): Payer: Self-pay

## 2023-01-16 ENCOUNTER — Other Ambulatory Visit (HOSPITAL_COMMUNITY): Payer: Self-pay

## 2023-01-17 ENCOUNTER — Other Ambulatory Visit (HOSPITAL_COMMUNITY): Payer: Self-pay

## 2023-01-18 ENCOUNTER — Other Ambulatory Visit (HOSPITAL_COMMUNITY): Payer: Self-pay

## 2023-02-18 ENCOUNTER — Other Ambulatory Visit (HOSPITAL_COMMUNITY): Payer: Self-pay

## 2023-02-18 MED ORDER — WEGOVY 2.4 MG/0.75ML ~~LOC~~ SOAJ
2.4000 mg | SUBCUTANEOUS | 3 refills | Status: DC
  Filled 2023-02-18: qty 3, 28d supply, fill #0
  Filled 2023-04-02: qty 3, 28d supply, fill #1
  Filled 2023-04-29 – 2023-05-30 (×2): qty 3, 28d supply, fill #2
  Filled 2023-07-05 – 2023-08-16 (×2): qty 3, 28d supply, fill #3

## 2023-02-26 ENCOUNTER — Other Ambulatory Visit (HOSPITAL_COMMUNITY): Payer: Self-pay

## 2023-05-13 ENCOUNTER — Other Ambulatory Visit (HOSPITAL_COMMUNITY): Payer: Self-pay

## 2023-05-15 ENCOUNTER — Other Ambulatory Visit (HOSPITAL_COMMUNITY): Payer: Self-pay

## 2023-05-30 ENCOUNTER — Other Ambulatory Visit: Payer: Self-pay

## 2023-06-06 ENCOUNTER — Other Ambulatory Visit: Payer: Self-pay

## 2023-06-06 ENCOUNTER — Other Ambulatory Visit (HOSPITAL_COMMUNITY): Payer: Self-pay

## 2023-07-05 ENCOUNTER — Telehealth: Payer: Self-pay | Admitting: Physician Assistant

## 2023-07-05 DIAGNOSIS — R3989 Other symptoms and signs involving the genitourinary system: Secondary | ICD-10-CM

## 2023-07-05 MED ORDER — NITROFURANTOIN MONOHYD MACRO 100 MG PO CAPS: 100.0000 mg | ORAL_CAPSULE | Freq: Two times a day (BID) | ORAL | 0 refills | Status: DC

## 2023-07-05 NOTE — Progress Notes (Signed)

## 2023-07-17 ENCOUNTER — Other Ambulatory Visit (HOSPITAL_COMMUNITY): Payer: Self-pay

## 2023-07-17 ENCOUNTER — Other Ambulatory Visit: Payer: Self-pay

## 2023-08-16 ENCOUNTER — Other Ambulatory Visit: Payer: Self-pay

## 2023-08-27 ENCOUNTER — Other Ambulatory Visit: Payer: Self-pay

## 2023-09-30 ENCOUNTER — Other Ambulatory Visit (HOSPITAL_COMMUNITY): Payer: Self-pay

## 2023-09-30 ENCOUNTER — Other Ambulatory Visit: Payer: Self-pay

## 2023-09-30 MED ORDER — WEGOVY 2.4 MG/0.75ML ~~LOC~~ SOAJ
2.4000 mg | SUBCUTANEOUS | 5 refills | Status: AC
  Filled 2023-09-30: qty 3, 28d supply, fill #0

## 2023-09-30 MED ORDER — WEGOVY 2.4 MG/0.75ML ~~LOC~~ SOAJ
2.4000 mg | SUBCUTANEOUS | 5 refills | Status: AC
  Filled 2023-09-30 (×2): qty 3, 28d supply, fill #0
  Filled 2023-11-21 – 2024-01-31 (×2): qty 3, 28d supply, fill #1

## 2023-10-01 ENCOUNTER — Other Ambulatory Visit: Payer: Self-pay

## 2023-10-03 ENCOUNTER — Other Ambulatory Visit: Payer: Self-pay

## 2023-10-10 ENCOUNTER — Other Ambulatory Visit: Payer: Self-pay

## 2023-11-20 DIAGNOSIS — R000 Tachycardia, unspecified: Secondary | ICD-10-CM | POA: Insufficient documentation

## 2023-11-20 NOTE — Progress Notes (Signed)
 Cardiology Office Note:  .   Date:  11/20/2023  ID:  [name], DOB 05-20-1975, MRN [id number] PCP: Patient, No Pcp Per  [facility] Providers Cardiologist:  Newman Lawrence, MD PCP: Patient, No Pcp Per  Chief Complaint  Patient presents with   Elevated heart rate     [name] is a 48 y.o. female with hyperlipidemia, family h/o early CAD, elevated heart rate  Discussed the use of AI scribe software for clinical note transcription with the patient, who gave verbal consent to proceed.  History of Present Illness [name] is a 48 year old female who presents with elevated heart rate. She was referred by her endocrinologist for evaluation of her elevated heart rate.  She experiences elevated resting heart rates in the 90s to 100s, with significant increases during minimal exertion. Lightheadedness occurs with intense exercise, and she feels flushed when her heart rate is elevated. Occasional dizziness is noted with positional changes. There is no chest pain or shortness of breath.  Her family history includes significant cardiac issues, with her father having undergone a heart transplant due to cardiomegaly and congestive heart failure, and several paternal relatives with heart attacks.  Recent lab work shows an LDL of 142 mg/dL and total cholesterol of 226 mg/dL. Her diet has declined in healthfulness compared to two years ago. She hydrates with four to five cups of water daily, does not smoke or consume alcohol, and has infrequent caffeine intake.      Vitals:   11/21/23 1508  BP: 126/72  Pulse: 89  SpO2: 99%      Review of Systems  Cardiovascular:  Negative for chest pain, dyspnea on exertion, leg swelling, palpitations and syncope.        Studies Reviewed: [name]        EKG 11/21/2023: Normal sinus rhythm Normal ECG No previous ECGs available    Labs 10/2023: Chol 226, TG 139, HDL 59, LDL 142 HbA1C 4.8% Cr 0.87, eGFR 83 TSH  1.2     Physical Exam Vitals and nursing note reviewed.  Constitutional:      General: She is not in acute distress. Neck:     Vascular: No JVD.  Cardiovascular:     Rate and Rhythm: Normal rate and regular rhythm.     Heart sounds: Normal heart sounds. No murmur heard. Pulmonary:     Effort: Pulmonary effort is normal.     Breath sounds: Normal breath sounds. No wheezing or rales.  Musculoskeletal:     Right lower leg: No edema.     Left lower leg: No edema.      VISIT DIAGNOSES:   ICD-10-CM   1. Tachycardia  R00.0 EKG 12-Lead    2. Elevated LDL cholesterol level  E78.00 CT CARDIAC SCORING (SELF PAY ONLY)    3. Family history of early CAD  Z5.49 CT CARDIAC SCORING (SELF PAY ONLY)       [name] is a 48 y.o. female with hyperlipidemia, family h/o early CAD, elevated heart rate Assessment & Plan Elevated LDL cholesterol: LDL at 142 mg/dL, total cholesterol 773 mg/dL. Family history of cardiac issues. Non-fasting status not affecting LDL. Dietary habits regressed. - Recommended Mediterranean-style diet. - Ordered calcium score scan. - If calcium score zero, focus on diet and lifestyle, repeat lipid panel in three months. - If calcium score elevated, start statin with diet and lifestyle modifications.  Elevated resting heart rate: Resting heart rate in 90s to 100s due to  inadequate hydration and lack of physical activity. EKG normal, no murmur. - Encouraged 2-3 liters of water daily. - Encouraged increased physical activity. - No need for metoprolol or further cardiac testing.         F/u as needed  Signed, Newman JINNY Lawrence, MD

## 2023-11-21 ENCOUNTER — Encounter: Payer: Self-pay | Admitting: Cardiology

## 2023-11-21 ENCOUNTER — Ambulatory Visit: Payer: Self-pay | Attending: Cardiology | Admitting: Cardiology

## 2023-11-21 VITALS — BP 126/72 | HR 89 | Ht 64.0 in | Wt 158.0 lb

## 2023-11-21 DIAGNOSIS — Z8249 Family history of ischemic heart disease and other diseases of the circulatory system: Secondary | ICD-10-CM | POA: Diagnosis not present

## 2023-11-21 DIAGNOSIS — R000 Tachycardia, unspecified: Secondary | ICD-10-CM

## 2023-11-21 DIAGNOSIS — E7800 Pure hypercholesterolemia, unspecified: Secondary | ICD-10-CM | POA: Diagnosis not present

## 2023-11-21 NOTE — Patient Instructions (Signed)
  Testing/Procedures: CALCIUM SCORE   CT scanning for a cardiac calcium score (CAT scanning), is a noninvasive, special x-ray that produces cross-sectional images of the body using x-rays and a computer. CT scans help physicians diagnose and treat medical conditions. For some CT exams, a contrast material is used to enhance visibility in the area of the body being studied. CT scans provide greater clarity and reveal more details than regular x-ray exams. Your physician has requested that you have a coronary calcium score performed. This is not covered by insurance and will be an out-of-pocket cost of approximately $99.   Follow-Up: At [facility], you and your health needs are our priority.  As part of our continuing mission to provide you with exceptional heart care, our providers are all part of one team.  This team includes your primary Cardiologist (physician) and Advanced Practice Providers or APPs (Physician Assistants and Nurse Practitioners) who all work together to provide you with the care you need, when you need it.  Your next appointment:   AS NEEDED   Provider:   Newman JINNY Lawrence, MD

## 2023-11-22 ENCOUNTER — Encounter: Payer: Self-pay | Admitting: Cardiology

## 2023-11-22 DIAGNOSIS — E7800 Pure hypercholesterolemia, unspecified: Secondary | ICD-10-CM | POA: Insufficient documentation

## 2023-11-22 DIAGNOSIS — Z8249 Family history of ischemic heart disease and other diseases of the circulatory system: Secondary | ICD-10-CM | POA: Insufficient documentation

## 2023-11-29 ENCOUNTER — Ambulatory Visit (HOSPITAL_COMMUNITY): Admission: RE | Admit: 2023-11-29 | Source: Ambulatory Visit

## 2023-12-02 ENCOUNTER — Other Ambulatory Visit: Payer: Self-pay
